# Patient Record
Sex: Female | Born: 1995 | Race: Black or African American | Hispanic: No | Marital: Single | Smoking: Current some day smoker
Health system: Southern US, Community
[De-identification: ages and names within clinical notes are randomized; demographics above are authoritative.]

## PROBLEM LIST (undated history)

## (undated) ENCOUNTER — Inpatient Hospital Stay: Payer: Self-pay

## (undated) DIAGNOSIS — A770 Spotted fever due to Rickettsia rickettsii: Secondary | ICD-10-CM

## (undated) DIAGNOSIS — R569 Unspecified convulsions: Secondary | ICD-10-CM

## (undated) DIAGNOSIS — G6281 Critical illness polyneuropathy: Secondary | ICD-10-CM

## (undated) DIAGNOSIS — Z20828 Contact with and (suspected) exposure to other viral communicable diseases: Secondary | ICD-10-CM

## (undated) DIAGNOSIS — G610 Guillain-Barre syndrome: Secondary | ICD-10-CM

## (undated) DIAGNOSIS — R079 Chest pain, unspecified: Secondary | ICD-10-CM

## (undated) HISTORY — DX: Contact with and (suspected) exposure to other viral communicable diseases: Z20.828

## (undated) HISTORY — PX: NO PAST SURGERIES: SHX2092

## (undated) HISTORY — DX: Spotted fever due to Rickettsia rickettsii: A77.0

---
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
## 2017-08-06 ENCOUNTER — Other Ambulatory Visit: Payer: Self-pay | Admitting: Physician Assistant

## 2017-08-06 DIAGNOSIS — Z3482 Encounter for supervision of other normal pregnancy, second trimester: Secondary | ICD-10-CM

## 2017-08-10 ENCOUNTER — Ambulatory Visit: Payer: Self-pay

## 2017-08-11 ENCOUNTER — Ambulatory Visit
Admission: RE | Admit: 2017-08-11 | Discharge: 2017-08-11 | Disposition: A | Discharge status: Home / Self Care | Payer: Self-pay | Source: Ambulatory Visit | Attending: Physician Assistant | Admitting: Physician Assistant

## 2017-08-11 DIAGNOSIS — Z3482 Encounter for supervision of other normal pregnancy, second trimester: Secondary | ICD-10-CM

## 2017-08-11 DIAGNOSIS — Z3689 Encounter for other specified antenatal screening: Secondary | ICD-10-CM | POA: Insufficient documentation

## 2017-09-08 ENCOUNTER — Other Ambulatory Visit: Payer: Self-pay | Admitting: Obstetrics & Gynecology

## 2017-09-08 DIAGNOSIS — Z363 Encounter for antenatal screening for malformations: Secondary | ICD-10-CM

## 2017-09-09 ENCOUNTER — Ambulatory Visit: Payer: Self-pay

## 2017-09-09 ENCOUNTER — Inpatient Hospital Stay: Admission: RE | Admit: 2017-09-09 | Payer: Self-pay | Source: Ambulatory Visit

## 2017-09-16 ENCOUNTER — Encounter
Admission: RE | Admit: 2017-09-16 | Discharge: 2017-09-16 | Disposition: A | Discharge status: Home / Self Care | Payer: Self-pay | Source: Ambulatory Visit | Attending: Anesthesiology | Admitting: Anesthesiology

## 2017-09-16 NOTE — Consult Note (Signed)
[facility] Anesthesia Consultation  Krista Perkins BJY:[id number] DOB: 11/28/1995 DOA: 09/16/2017 PCP[facility]   Requesting physician: Health department Date of consultation: 09/16/17  Reason for consultation: Obesity during pregnancy  CHIEF COMPLAINT:  Obesity during pregnancy  HISTORY OF PRESENT ILLNESS: Krista Perkins  is a 22 y.o. female with a known history of morbid obesity  PAST MEDICAL HISTORY:  Morbid obesity PAST SURGICAL HISTORY:  SOCIAL HISTORY:  Social History   Tobacco Use  . Smoking status: Not on file  Substance Use Topics  . Alcohol use: Not on file    FAMILY HISTORY: No family history on file.  DRUG ALLERGIES: Allergies not on file  REVIEW OF SYSTEMS:   RESPIRATORY: No cough, shortness of breath, wheezing.  CARDIOVASCULAR: No chest pain, orthopnea, edema.  HEMATOLOGY: No anemia, easy bruising or bleeding SKIN: No rash or lesion. NEUROLOGIC: No tingling, numbness, weakness.  PSYCHIATRY: No anxiety or depression.   MEDICATIONS AT HOME:  Prior to Admission medications   Not on File      PHYSICAL EXAMINATION:   VITAL SIGNS: There were no vitals taken for this visit.  GENERAL:  22 y.o.-year-old patient no acute distress.  HEENT: Head atraumatic, normocephalic. Oropharynx and nasopharynx clear. MP IV, TM distance >3 cm, normal mouth opening.    IMPRESSION AND PLAN:   Krista Perkins  is a 22 y.o. female presenting with obesity during pregnancy. BMI is currently 45 at [redacted] weeks gestation.   We discussed analgesic options during labor including epidural analgesia. Discussed that in obesity there can be increased difficulty with epidural placement or even failure of successful epidural. We also discussed that even after successful epidural placement there is increased risk of catheter migration out of the epidural space that would require catheter replacement. Discussed increased risk of difficult  intubation during pregnancy should an emergency cesarean delivery be required.   We discussed that the pt should transfer of OB care to a facility with a higher maternal level of care designation due to her obesity and airway exam today at 18 weeks. Pt stated that she understood.

## 2017-11-29 ENCOUNTER — Emergency Department
Admission: EM | Admit: 2017-11-29 | Discharge: 2017-11-30 | Disposition: A | Discharge status: Home / Self Care | Payer: Medicaid Other | Attending: Emergency Medicine | Admitting: Emergency Medicine

## 2017-11-29 ENCOUNTER — Emergency Department: Payer: Medicaid Other

## 2017-11-29 DIAGNOSIS — O26891 Other specified pregnancy related conditions, first trimester: Secondary | ICD-10-CM | POA: Diagnosis present

## 2017-11-29 DIAGNOSIS — Z87891 Personal history of nicotine dependence: Secondary | ICD-10-CM | POA: Insufficient documentation

## 2017-11-29 DIAGNOSIS — Z3A08 8 weeks gestation of pregnancy: Secondary | ICD-10-CM | POA: Diagnosis not present

## 2017-11-29 DIAGNOSIS — R079 Chest pain, unspecified: Secondary | ICD-10-CM

## 2017-11-29 DIAGNOSIS — R0789 Other chest pain: Secondary | ICD-10-CM

## 2017-11-29 DIAGNOSIS — Z3490 Encounter for supervision of normal pregnancy, unspecified, unspecified trimester: Secondary | ICD-10-CM

## 2017-11-29 LAB — CBC
HEMATOCRIT: 32.3 % — AB (ref 35.0–47.0)
HEMOGLOBIN: 11 g/dL — AB (ref 12.0–16.0)
MCH: 30.6 pg (ref 26.0–34.0)
MCHC: 34.1 g/dL (ref 32.0–36.0)
MCV: 89.6 fL (ref 80.0–100.0)
Platelets: 234 10*3/uL (ref 150–440)
RBC: 3.61 MIL/uL — ABNORMAL LOW (ref 3.80–5.20)
RDW: 14 % (ref 11.5–14.5)
WBC: 13.9 10*3/uL — ABNORMAL HIGH (ref 3.6–11.0)

## 2017-11-29 LAB — BASIC METABOLIC PANEL
ANION GAP: 8 (ref 5–15)
BUN: 5 mg/dL — ABNORMAL LOW (ref 6–20)
CHLORIDE: 105 mmol/L (ref 101–111)
CO2: 22 mmol/L (ref 22–32)
Calcium: 8.8 mg/dL — ABNORMAL LOW (ref 8.9–10.3)
Creatinine, Ser: 0.49 mg/dL (ref 0.44–1.00)
GFR calc Af Amer: >60 mL/min (ref >60)
GFR calc non Af Amer: >60 mL/min (ref >60)
GLUCOSE: 91 mg/dL (ref 65–99)
POTASSIUM: 3.3 mmol/L — AB (ref 3.5–5.1)
Sodium: 135 mmol/L (ref 135–145)

## 2017-11-29 LAB — POCT PREGNANCY, URINE: Preg Test, Ur: POSITIVE — AB

## 2017-11-29 LAB — TROPONIN I: Troponin I: <0.03 ng/mL (ref <0.03)

## 2017-11-29 NOTE — ED Triage Notes (Signed)
Patient c/o medial chest pain, nausea, and SOB X 2 week.

## 2017-11-29 NOTE — ED Notes (Signed)
POC Urine Preg POSITIVE

## 2017-11-29 NOTE — ED Provider Notes (Signed)
[facility] Emergency Department Provider Note   ____________________________________________   First MD Initiated Contact with Patient 11/29/17 2349     (approximate)  I have reviewed the triage vital signs and the nursing notes.   HISTORY  Chief Complaint Chest Pain    HPI Krista Perkins is a 22 y.o. female G2, P1 approximately 8 months pregnant who presents to the ED from home with a chief complaint of chest pain, nausea and shortness of breath for the past 2 weeks.  Patient reports intermittent symptoms, usually worse at night and when laying flat.  Presents tonight because she is tired of hurting, not because things are worse.  Denies recent fever, chills, cough, congestion, abdominal pain, vaginal bleeding, vomiting or dysuria.  Denies recent travel or trauma.  [facility].   Past medical history None  There are no active problems to display for this patient.   History reviewed. No pertinent surgical history.  Prior to Admission medications   Not on File    Allergies Patient has no known allergies.  No family history on file.  Social History Social History   Tobacco Use  . Smoking status: Former Games developermoker  . Smokeless tobacco: Never Used  Substance Use Topics  . Alcohol use: Not Currently  . Drug use: Never    Review of Systems  Constitutional: No fever/chills Eyes: No visual changes. ENT: No sore throat. Cardiovascular: Positive for chest pain. Respiratory: Positive for shortness of breath. Gastrointestinal: No abdominal pain.  No nausea, no vomiting.  No diarrhea.  No constipation. Genitourinary: Negative for dysuria. Musculoskeletal: Negative for back pain. Skin: Negative for rash. Neurological: Negative for headaches, focal weakness or numbness.   ____________________________________________   PHYSICAL EXAM:  VITAL SIGNS: ED Triage Vitals  Enc Vitals Group     BP  11/29/17 2207 123/70     Pulse Rate 11/29/17 2207 (!) 102     Resp 11/29/17 2207 18     Temp 11/29/17 2207 98.8 F (37.1 C)     Temp Source 11/29/17 2207 Oral     SpO2 11/29/17 2207 99 %     Weight 11/29/17 2207 245 lb (111.1 kg)     Height 11/29/17 2207 5\' 1"  (1.549 m)     Head Circumference --      Peak Flow --      Pain Score 11/29/17 2212 7     Pain Loc --      Pain Edu? --      Excl. in GC? --     Constitutional: Alert and oriented. Well appearing and in no acute distress. Eyes: Conjunctivae are normal. PERRL. EOMI. Head: Atraumatic. Nose: No congestion/rhinnorhea. Mouth/Throat: Mucous membranes are moist.  Oropharynx non-erythematous. Neck: No stridor.   Cardiovascular: Normal rate, regular rhythm. Grossly normal heart sounds.  Good peripheral circulation. Respiratory: Normal respiratory effort.  No retractions. Lungs CTAB.  Anterior chest wall tender to palpation. Gastrointestinal: Obese.  Soft and nontender to light or deep palpation. No distention. No abdominal bruits. No CVA tenderness. Musculoskeletal: No lower extremity tenderness nor edema.  No joint effusions. Neurologic:  Normal speech and language. No gross focal neurologic deficits are appreciated. No gait instability. Skin:  Skin is warm, dry and intact. No rash noted. Psychiatric: Mood and affect are normal. Speech and behavior are normal.  ____________________________________________   LABS (all labs ordered are listed, but only abnormal results are displayed)  Labs Reviewed  BASIC METABOLIC PANEL -  Abnormal; Notable for the following components:      Result Value   Potassium 3.3 (*)    BUN 5 (*)    Calcium 8.8 (*)    All other components within normal limits  CBC - Abnormal; Notable for the following components:   WBC 13.9 (*)    RBC 3.61 (*)    Hemoglobin 11.0 (*)    HCT 32.3 (*)    All other components within normal limits  HCG, QUANTITATIVE, PREGNANCY - Abnormal; Notable for the following  components:   hCG, Beta Chain, Quant, S 6,497 (*)    All other components within normal limits  HEPATIC FUNCTION PANEL - Abnormal; Notable for the following components:   Albumin 3.0 (*)    Bilirubin, Direct <0.1 (*)    All other components within normal limits  POCT PREGNANCY, URINE - Abnormal; Notable for the following components:   Preg Test, Ur POSITIVE (*)    All other components within normal limits  TROPONIN I  LIPASE, BLOOD  POC URINE PREG, ED   ____________________________________________  EKG  ED ECG REPORT I, Teralyn Mullins J, the attending physician, personally viewed and interpreted this ECG.   Date: 11/30/2017  EKG Time: 2209  Rate: 96  Rhythm: normal EKG, normal sinus rhythm  Axis: Normal  Intervals:none  ST&T Change: Nonspecific  ____________________________________________  RADIOLOGY  ED MD interpretation: No acute cardiopulmonary process; nondiagnostic for PE  Official radiology report(s): Dg Chest 2 View  Result Date: 11/29/2017 CLINICAL DATA:  Medial chest pain, nausea, and shortness of breath for 2 weeks. Patient is [redacted] weeks pregnant and was shielded. EXAM: CHEST - 2 VIEW COMPARISON:  12/16/2011 FINDINGS: The heart size and mediastinal contours are within normal limits. Both lungs are clear. The visualized skeletal structures are unremarkable. IMPRESSION: No active cardiopulmonary disease. Electronically Signed   By: Burman Nieves M.D.   On: 11/29/2017 23:03   Ct Angio Chest Pe W/cm &/or Wo Cm  Result Date: 11/30/2017 CLINICAL DATA:  Medial chest pain, nausea, and shortness of breath for 2 weeks. Eight months pregnant and shielded. The scanner paused during the initial injection, resulting in a suboptimal contrast bolus. The patient was reinjected and re-scanned. EXAM: CT ANGIOGRAPHY CHEST WITH CONTRAST TECHNIQUE: Multidetector CT imaging of the chest was performed using the standard protocol during bolus administration of intravenous contrast. Multiplanar  CT image reconstructions and MIPs were obtained to evaluate the vascular anatomy. CONTRAST:  75mL ISOVUE-370 IOPAMIDOL (ISOVUE-370) INJECTION 76% COMPARISON:  12/16/2011 FINDINGS: Cardiovascular: In spite of reinjection and rescanning of the patient, there is poor bolus timing resulting in a poor contrast bolus. Images are nondiagnostic for evaluation of the pulmonary arteries for pulmonary embolus. Normal caliber thoracic aorta without evidence of dissection. Great vessel origins are patent. Heart size is normal. No pericardial effusion. Mediastinum/Nodes: Small esophageal hiatal hernia. Esophagus is otherwise decompressed. No significant lymphadenopathy in the chest. Axillary lymph nodes are not pathologically enlarged. Lungs/Pleura: Motion artifact limits evaluation. No consolidation or edema is suggested. No pleural effusions. No pneumothorax. Airways appear patent. Upper Abdomen: No focal acute abnormality is demonstrated. Musculoskeletal: No chest wall abnormality. No acute or significant osseous findings. Review of the MIP images confirms the above findings. IMPRESSION: 1. Due to poor contrast bolus timing, the examination is nondiagnostic for evaluation of the pulmonary arteries. 2. No evidence of active pulmonary disease. Electronically Signed   By: Burman Nieves M.D.   On: 11/30/2017 02:19    ____________________________________________   PROCEDURES  Procedure(s) performed: None  Procedures  Critical Care performed: No  ____________________________________________   INITIAL IMPRESSION / ASSESSMENT AND PLAN / ED COURSE  As part of my medical decision making, I reviewed the following data within the electronic MEDICAL RECORD NUMBER Nursing notes reviewed and incorporated, Labs reviewed, EKG interpreted, Old chart reviewed, Radiograph reviewed and Notes from prior ED visits   22 year old female G2P1 approximately 8 months pregnant who presents with a 2-week history of central chest pain  and shortness of breath.  Differential diagnosis includes but is not limited to CAD, PE, aortic dissection, musculoskeletal, etc.  Will add LFTs and lipase.  Chest x-ray unremarkable.  Will obtain CT chest to evaluate for PE.  Will try 20 mg IV Pepcid and 4 mg IV Zofran for symptomatic relief of patient's symptoms.   Clinical Course as of Dec 01 322  Mon Nov 30, 2017  0221 Patient resting in no acute distress.  Awaiting results of CT scan.  Fetal heart rate noted.   [JS]  0225 Updated patient on all test results.  I have very low suspicion for pulmonary embolus in this patient.  She is not hypoxic, has normal troponin in the setting of a 2-week history of chest pain which is reproducible on exam.  No pain after Pepcid administration.  There is no calf swelling or tenderness.  No evidence of pregnancy-induced hypertension.  Will discharge home with prescription for Zofran and patient will follow-up with her OB/GYN.  Strict return precautions given.  Patient verbalizes understanding and agrees with plan of care.   [JS]    Clinical Course User Index [JS] Irean Hong, MD     ____________________________________________   FINAL CLINICAL IMPRESSION(S) / ED DIAGNOSES  Final diagnoses:  Chest wall pain  Nonspecific chest pain  Pregnancy, unspecified gestational age     ED Discharge Orders    None       Note:  This document was prepared using Dragon voice recognition software and may include unintentional dictation errors.    Irean Hong, MD 11/30/17 [phone]

## 2017-11-30 ENCOUNTER — Emergency Department: Payer: Medicaid Other

## 2017-11-30 LAB — HEPATIC FUNCTION PANEL
ALBUMIN: 3 g/dL — AB (ref 3.5–5.0)
ALT: 19 U/L (ref 14–54)
AST: 22 U/L (ref 15–41)
Alkaline Phosphatase: 112 U/L (ref 38–126)
Bilirubin, Direct: <0.1 mg/dL — ABNORMAL LOW (ref 0.1–0.5)
TOTAL PROTEIN: 7 g/dL (ref 6.5–8.1)
Total Bilirubin: 0.3 mg/dL (ref 0.3–1.2)

## 2017-11-30 LAB — LIPASE, BLOOD: LIPASE: 20 U/L (ref 11–51)

## 2017-11-30 LAB — HCG, QUANTITATIVE, PREGNANCY: hCG, Beta Chain, Quant, S: 6497 m[IU]/mL — ABNORMAL HIGH (ref <5)

## 2017-11-30 MED ORDER — IOPAMIDOL (ISOVUE-370) INJECTION 76%
75.0000 mL | Freq: Once | INTRAVENOUS | Status: AC | PRN
  Administered 2017-11-30: 75 mL via INTRAVENOUS

## 2017-11-30 MED ORDER — FAMOTIDINE IN NACL 20-0.9 MG/50ML-% IV SOLN
20.0000 mg | Freq: Once | INTRAVENOUS | Status: AC
  Administered 2017-11-30: 20 mg via INTRAVENOUS
  Filled 2017-11-30: qty 50

## 2017-11-30 MED ORDER — ONDANSETRON HCL 4 MG/2ML IJ SOLN
4.0000 mg | Freq: Once | INTRAMUSCULAR | Status: AC
  Administered 2017-11-30: 4 mg via INTRAVENOUS
  Filled 2017-11-30: qty 2

## 2017-11-30 MED ORDER — ONDANSETRON 4 MG PO TBDP: 4.0000 mg | ORAL_TABLET | Freq: Three times a day (TID) | ORAL | 0 refills | Status: DC | PRN

## 2017-11-30 NOTE — Discharge Instructions (Addendum)
1.  You may take Zofran as needed for nausea. 2.  Return to the ER for worsening symptoms, persistent vomiting, difficulty breathing or other concerns. 

## 2017-12-08 ENCOUNTER — Telehealth: Payer: Self-pay | Admitting: Obstetrics & Gynecology

## 2017-12-08 NOTE — Telephone Encounter (Signed)
Called and left vociemail for patient to call back to be schedule for NOB transfer from [facility]

## 2017-12-08 NOTE — Telephone Encounter (Signed)
[facility]

## 2017-12-12 ENCOUNTER — Observation Stay
Admission: EM | Admit: 2017-12-12 | Discharge: 2017-12-12 | Disposition: A | Discharge status: Home / Self Care | Payer: Medicaid Other | Attending: Obstetrics and Gynecology | Admitting: Obstetrics and Gynecology

## 2017-12-12 DIAGNOSIS — Z87891 Personal history of nicotine dependence: Secondary | ICD-10-CM | POA: Diagnosis not present

## 2017-12-12 DIAGNOSIS — Z3A00 Weeks of gestation of pregnancy not specified: Secondary | ICD-10-CM | POA: Diagnosis not present

## 2017-12-12 DIAGNOSIS — Z833 Family history of diabetes mellitus: Secondary | ICD-10-CM | POA: Diagnosis not present

## 2017-12-12 DIAGNOSIS — O368130 Decreased fetal movements, third trimester, not applicable or unspecified: Secondary | ICD-10-CM

## 2017-12-12 DIAGNOSIS — O368190 Decreased fetal movements, unspecified trimester, not applicable or unspecified: Secondary | ICD-10-CM | POA: Diagnosis present

## 2017-12-12 DIAGNOSIS — O0993 Supervision of high risk pregnancy, unspecified, third trimester: Secondary | ICD-10-CM

## 2017-12-12 DIAGNOSIS — Z8249 Family history of ischemic heart disease and other diseases of the circulatory system: Secondary | ICD-10-CM | POA: Insufficient documentation

## 2017-12-12 DIAGNOSIS — Z3A35 35 weeks gestation of pregnancy: Secondary | ICD-10-CM

## 2017-12-12 LAB — URINALYSIS, ROUTINE W REFLEX MICROSCOPIC
Bilirubin Urine: NEGATIVE
GLUCOSE, UA: NEGATIVE mg/dL
Hgb urine dipstick: NEGATIVE
Ketones, ur: NEGATIVE mg/dL
Nitrite: NEGATIVE
PH: 5 (ref 5.0–8.0)
PROTEIN: 100 mg/dL — AB
Specific Gravity, Urine: 1.032 — ABNORMAL HIGH (ref 1.005–1.030)
Squamous Epithelial / LPF: >50 — ABNORMAL HIGH (ref 0–5)

## 2017-12-12 MED ORDER — NITROFURANTOIN MONOHYD MACRO 100 MG PO CAPS: 100.0000 mg | ORAL_CAPSULE | Freq: Two times a day (BID) | ORAL | 0 refills | Status: AC

## 2017-12-12 NOTE — OB Triage Note (Signed)
Patient came around 2100 from home c/o lower abdominal pressure and decrease fetal movement. Patient reports abdominal pressure for past few days but today the pain worsen with increase activity. Patient reports no fetal movement at all today. EFM placed. Fetal movement noticed. Category I strip. Maternal vital signs WNL. PAaient denies LOF. Denies bleeding. No risk factors this pregnancy per patient.

## 2017-12-12 NOTE — Final Progress Note (Signed)
Triage Note  Krista Perkins is an 22 y.o. female.  HPI: She presented to triage complaining of lower pelvic pressure and decreased fetal movement. She said that she had not felt the baby move all day. She also mentioned that [facility] she was told that she may have a urinary tract infection, but was not given a prescription for any antibiotics. Denies contractions. Denies leakage of fluid. Denies vaginal bleeding.   Past Medical History:  Diagnosis Date  . Medical history non-contributory     History reviewed. No pertinent surgical history.  Family History  Problem Relation Age of Onset  . Diabetes Mother   . Heart disease Mother     Social History:  reports that she has quit smoking. She has never used smokeless tobacco. She reports that she drank alcohol. She reports that she does not use drugs.  Allergies: No Known Allergies  Medications: I have reviewed the patient's current medications.  Results for orders placed or performed during the hospital encounter of 12/12/17 (from the past 48 hour(s))  Urinalysis, Routine w reflex microscopic     Status: Abnormal   Collection Time: 12/12/17  9:41 PM  Result Value Ref Range   Color, Urine AMBER (A) YELLOW    Comment: BIOCHEMICALS MAY BE AFFECTED BY COLOR   APPearance CLOUDY (A) CLEAR   Specific Gravity, Urine 1.032 (H) 1.005 - 1.030   pH 5.0 5.0 - 8.0   Glucose, UA NEGATIVE NEGATIVE mg/dL   Hgb urine dipstick NEGATIVE NEGATIVE   Bilirubin Urine NEGATIVE NEGATIVE   Ketones, ur NEGATIVE NEGATIVE mg/dL   Protein, ur 960100 (A) NEGATIVE mg/dL   Nitrite NEGATIVE NEGATIVE   Leukocytes, UA MODERATE (A) NEGATIVE   RBC / HPF 11-20 0 - 5 RBC/hpf   WBC, UA >50 (H) 0 - 5 WBC/hpf   Bacteria, UA MANY (A) NONE SEEN   Squamous Epithelial / LPF >50 (H) 0 - 5   Mucus PRESENT     Comment: Performed at [facility], [address] [place], [place] [id number]    No results found.  Review of Systems   Constitutional: Negative for chills, fever, malaise/fatigue and weight loss.  HENT: Negative for congestion, hearing loss and sinus pain.   Eyes: Negative for blurred vision and double vision.  Respiratory: Negative for cough, sputum production, shortness of breath and wheezing.   Cardiovascular: Negative for chest pain, palpitations, orthopnea and leg swelling.  Gastrointestinal: Negative for abdominal pain, constipation, diarrhea, nausea and vomiting.  Genitourinary: Negative for dysuria, flank pain, frequency, hematuria and urgency.  Musculoskeletal: Negative for back pain, falls and joint pain.  Skin: Negative for itching and rash.  Neurological: Negative for dizziness and headaches.  Psychiatric/Behavioral: Negative for depression, substance abuse and suicidal ideas. The patient is not nervous/anxious.    Blood pressure 133/74, pulse 98, temperature 98.7 F (37.1 C), temperature source Oral, resp. rate 18.  NST: 140 bpm baseline, moderate variability, 15x15 accelerations, none decelerations. Reactive, category I tracing   Assessment/Plan: 22 yo with decreased fetal movement and a UTI. Fetal status reassuring, follow up closely planned for July 3rd.  Prescription sent for her urinary tract infection, urine culture pending.   Discharged home.  Christanna R Schuman 12/12/2017, 10:33 PM

## 2017-12-12 NOTE — ED Notes (Signed)
Pt called for triage; no answer; told pt is in the BR

## 2017-12-12 NOTE — Discharge Summary (Signed)
  Please see final progress note. 

## 2017-12-14 LAB — URINE CULTURE

## 2017-12-15 NOTE — Progress Notes (Signed)
Sent with a prescription for a UTI

## 2017-12-16 ENCOUNTER — Encounter: Payer: Self-pay | Admitting: Obstetrics & Gynecology

## 2017-12-16 ENCOUNTER — Encounter: Payer: Self-pay | Admitting: Certified Nurse Midwife

## 2017-12-20 ENCOUNTER — Other Ambulatory Visit: Payer: Self-pay

## 2017-12-20 ENCOUNTER — Encounter: Payer: Self-pay | Admitting: Obstetrics and Gynecology

## 2017-12-20 ENCOUNTER — Inpatient Hospital Stay
Admission: EM | Admit: 2017-12-20 | Discharge: 2017-12-22 | DRG: 832 | Disposition: A | Discharge status: Home / Self Care | Payer: Medicaid Other | Source: Ambulatory Visit | Attending: Obstetrics and Gynecology | Admitting: Obstetrics and Gynecology

## 2017-12-20 DIAGNOSIS — O99613 Diseases of the digestive system complicating pregnancy, third trimester: Principal | ICD-10-CM | POA: Diagnosis present

## 2017-12-20 DIAGNOSIS — N12 Tubulo-interstitial nephritis, not specified as acute or chronic: Secondary | ICD-10-CM | POA: Diagnosis present

## 2017-12-20 DIAGNOSIS — O99283 Endocrine, nutritional and metabolic diseases complicating pregnancy, third trimester: Secondary | ICD-10-CM | POA: Diagnosis present

## 2017-12-20 DIAGNOSIS — Z8249 Family history of ischemic heart disease and other diseases of the circulatory system: Secondary | ICD-10-CM

## 2017-12-20 DIAGNOSIS — Z3A36 36 weeks gestation of pregnancy: Secondary | ICD-10-CM

## 2017-12-20 DIAGNOSIS — E876 Hypokalemia: Secondary | ICD-10-CM | POA: Diagnosis present

## 2017-12-20 DIAGNOSIS — Z87891 Personal history of nicotine dependence: Secondary | ICD-10-CM

## 2017-12-20 DIAGNOSIS — R109 Unspecified abdominal pain: Secondary | ICD-10-CM

## 2017-12-20 DIAGNOSIS — Z833 Family history of diabetes mellitus: Secondary | ICD-10-CM

## 2017-12-20 DIAGNOSIS — K2970 Gastritis, unspecified, without bleeding: Secondary | ICD-10-CM | POA: Diagnosis present

## 2017-12-20 DIAGNOSIS — O99013 Anemia complicating pregnancy, third trimester: Secondary | ICD-10-CM | POA: Diagnosis present

## 2017-12-20 DIAGNOSIS — Z6841 Body Mass Index (BMI) 40.0 and over, adult: Secondary | ICD-10-CM

## 2017-12-20 HISTORY — DX: Morbid (severe) obesity due to excess calories: E66.01

## 2017-12-20 HISTORY — DX: Chest pain, unspecified: R07.9

## 2017-12-20 NOTE — OB Triage Note (Signed)
Pt presents to L&D with c/o R sided back/flank pain, N/V for >1week. Pt reports she was recently diagnosed with a UTI but was unable to take antibiotics d/t inability to take pills. Pt denies contractions, vaginal bleeding, decreased fetal movement, or leaking of fluid. Toco and EFM applied and explained, all questions answered. Will monitor closely.

## 2017-12-21 ENCOUNTER — Observation Stay: Payer: Medicaid Other

## 2017-12-21 DIAGNOSIS — O99613 Diseases of the digestive system complicating pregnancy, third trimester: Secondary | ICD-10-CM | POA: Diagnosis present

## 2017-12-21 DIAGNOSIS — Z87891 Personal history of nicotine dependence: Secondary | ICD-10-CM | POA: Diagnosis not present

## 2017-12-21 DIAGNOSIS — O99013 Anemia complicating pregnancy, third trimester: Secondary | ICD-10-CM | POA: Diagnosis present

## 2017-12-21 DIAGNOSIS — O99283 Endocrine, nutritional and metabolic diseases complicating pregnancy, third trimester: Secondary | ICD-10-CM | POA: Diagnosis present

## 2017-12-21 DIAGNOSIS — R112 Nausea with vomiting, unspecified: Secondary | ICD-10-CM | POA: Diagnosis present

## 2017-12-21 DIAGNOSIS — K2970 Gastritis, unspecified, without bleeding: Secondary | ICD-10-CM | POA: Diagnosis present

## 2017-12-21 DIAGNOSIS — R109 Unspecified abdominal pain: Secondary | ICD-10-CM | POA: Diagnosis not present

## 2017-12-21 DIAGNOSIS — O212 Late vomiting of pregnancy: Secondary | ICD-10-CM

## 2017-12-21 DIAGNOSIS — Z3A36 36 weeks gestation of pregnancy: Secondary | ICD-10-CM | POA: Diagnosis not present

## 2017-12-21 DIAGNOSIS — Z8249 Family history of ischemic heart disease and other diseases of the circulatory system: Secondary | ICD-10-CM | POA: Diagnosis not present

## 2017-12-21 DIAGNOSIS — O9989 Other specified diseases and conditions complicating pregnancy, childbirth and the puerperium: Secondary | ICD-10-CM | POA: Diagnosis not present

## 2017-12-21 DIAGNOSIS — Z6841 Body Mass Index (BMI) 40.0 and over, adult: Secondary | ICD-10-CM | POA: Diagnosis not present

## 2017-12-21 DIAGNOSIS — R079 Chest pain, unspecified: Secondary | ICD-10-CM | POA: Diagnosis not present

## 2017-12-21 DIAGNOSIS — N12 Tubulo-interstitial nephritis, not specified as acute or chronic: Secondary | ICD-10-CM | POA: Diagnosis present

## 2017-12-21 DIAGNOSIS — R000 Tachycardia, unspecified: Secondary | ICD-10-CM | POA: Diagnosis not present

## 2017-12-21 DIAGNOSIS — E876 Hypokalemia: Secondary | ICD-10-CM | POA: Diagnosis present

## 2017-12-21 DIAGNOSIS — Z833 Family history of diabetes mellitus: Secondary | ICD-10-CM | POA: Diagnosis not present

## 2017-12-21 DIAGNOSIS — R0789 Other chest pain: Secondary | ICD-10-CM | POA: Diagnosis not present

## 2017-12-21 LAB — URINALYSIS, COMPLETE (UACMP) WITH MICROSCOPIC
Bilirubin Urine: NEGATIVE
Glucose, UA: NEGATIVE mg/dL
HGB URINE DIPSTICK: NEGATIVE
Ketones, ur: 20 mg/dL — AB
NITRITE: NEGATIVE
Protein, ur: NEGATIVE mg/dL
SPECIFIC GRAVITY, URINE: 1.009 (ref 1.005–1.030)
pH: 7 (ref 5.0–8.0)

## 2017-12-21 LAB — COMPREHENSIVE METABOLIC PANEL
ALK PHOS: 112 U/L (ref 38–126)
ALT: 14 U/L (ref 0–44)
ANION GAP: 5 (ref 5–15)
AST: 26 U/L (ref 15–41)
Albumin: 2.4 g/dL — ABNORMAL LOW (ref 3.5–5.0)
BILIRUBIN TOTAL: 0.6 mg/dL (ref 0.3–1.2)
CO2: 22 mmol/L (ref 22–32)
Calcium: 8.3 mg/dL — ABNORMAL LOW (ref 8.9–10.3)
Chloride: 109 mmol/L (ref 98–111)
Creatinine, Ser: 0.43 mg/dL — ABNORMAL LOW (ref 0.44–1.00)
GFR calc Af Amer: >60 mL/min (ref >60)
GLUCOSE: 92 mg/dL (ref 70–99)
POTASSIUM: 3.2 mmol/L — AB (ref 3.5–5.1)
Sodium: 136 mmol/L (ref 135–145)
TOTAL PROTEIN: 6.3 g/dL — AB (ref 6.5–8.1)

## 2017-12-21 LAB — CBC
HEMATOCRIT: 29.3 % — AB (ref 35.0–47.0)
HEMOGLOBIN: 9.9 g/dL — AB (ref 12.0–16.0)
MCH: 29.5 pg (ref 26.0–34.0)
MCHC: 34 g/dL (ref 32.0–36.0)
MCV: 86.7 fL (ref 80.0–100.0)
Platelets: 189 10*3/uL (ref 150–440)
RBC: 3.38 MIL/uL — ABNORMAL LOW (ref 3.80–5.20)
RDW: 14.3 % (ref 11.5–14.5)
WBC: 9.8 10*3/uL (ref 3.6–11.0)

## 2017-12-21 LAB — LIPASE, BLOOD: Lipase: 19 U/L (ref 11–51)

## 2017-12-21 MED ORDER — DIPHENHYDRAMINE HCL 50 MG/ML IJ SOLN
25.0000 mg | Freq: Three times a day (TID) | INTRAMUSCULAR | Status: DC | PRN
  Administered 2017-12-21: 25 mg via INTRAVENOUS
  Filled 2017-12-21: qty 1

## 2017-12-21 MED ORDER — HYDROXYZINE HCL 25 MG PO TABS
25.0000 mg | ORAL_TABLET | Freq: Four times a day (QID) | ORAL | Status: DC | PRN
  Administered 2017-12-21: 25 mg via ORAL
  Filled 2017-12-21 (×3): qty 1

## 2017-12-21 MED ORDER — ONDANSETRON HCL 4 MG/2ML IJ SOLN
4.0000 mg | Freq: Four times a day (QID) | INTRAMUSCULAR | Status: DC | PRN
  Administered 2017-12-21 – 2017-12-22 (×2): 4 mg via INTRAVENOUS
  Filled 2017-12-21: qty 2

## 2017-12-21 MED ORDER — DIPHENHYDRAMINE HCL 50 MG/ML IJ SOLN
25.0000 mg | Freq: Once | INTRAMUSCULAR | Status: AC
  Administered 2017-12-21: 25 mg via INTRAVENOUS

## 2017-12-21 MED ORDER — MORPHINE SULFATE (PF) 2 MG/ML IV SOLN
INTRAVENOUS | Status: AC
  Filled 2017-12-21: qty 1

## 2017-12-21 MED ORDER — LACTATED RINGERS IV BOLUS
1000.0000 mL | Freq: Once | INTRAVENOUS | Status: AC
  Administered 2017-12-21: 1000 mL via INTRAVENOUS

## 2017-12-21 MED ORDER — MORPHINE SULFATE (PF) 2 MG/ML IV SOLN
1.0000 mg | INTRAVENOUS | Status: DC | PRN
  Administered 2017-12-21 (×4): 1 mg via INTRAVENOUS
  Filled 2017-12-21 (×5): qty 1

## 2017-12-21 MED ORDER — POTASSIUM CHLORIDE 2 MEQ/ML IV SOLN
INTRAVENOUS | Status: DC
  Filled 2017-12-21: qty 1000

## 2017-12-21 MED ORDER — MORPHINE SULFATE (PF) 2 MG/ML IV SOLN
1.0000 mg | Freq: Once | INTRAVENOUS | Status: AC
  Administered 2017-12-21: 1 mg via INTRAVENOUS

## 2017-12-21 MED ORDER — DIPHENHYDRAMINE HCL 50 MG/ML IJ SOLN
INTRAMUSCULAR | Status: AC
  Filled 2017-12-21: qty 1

## 2017-12-21 MED ORDER — ONDANSETRON HCL 4 MG/2ML IJ SOLN
INTRAMUSCULAR | Status: AC
  Filled 2017-12-21: qty 2

## 2017-12-21 MED ORDER — SODIUM CHLORIDE 0.9 % IV SOLN
1.0000 g | INTRAVENOUS | Status: DC
  Administered 2017-12-21: 1 g via INTRAVENOUS
  Filled 2017-12-21: qty 10
  Filled 2017-12-21: qty 1

## 2017-12-21 MED ORDER — SODIUM CHLORIDE FLUSH 0.9 % IV SOLN
INTRAVENOUS | Status: AC
  Filled 2017-12-21: qty 10

## 2017-12-21 MED ORDER — POTASSIUM CHLORIDE IN NACL 20-0.9 MEQ/L-% IV SOLN
INTRAVENOUS | Status: DC
  Administered 2017-12-21: 1000 mL via INTRAVENOUS
  Administered 2017-12-21 – 2017-12-22 (×3): via INTRAVENOUS
  Filled 2017-12-21 (×7): qty 1000

## 2017-12-21 NOTE — H&P (Addendum)
Obstetric H&P   Chief Complaint: Flank pain, nausea and vomiting  Prenatal Care Provider: [facility]  History of Present Illness: 22 y.o. G3P1 [id number] by 01/18/2018, Date entered prior to episode creation presenting for right flank pain that has been present for three weeks. She endorses 9/10 pain in her right lower quadrant and right flank. Nothing makes it better or worse. She was seen at [facility] and here in triage for UTI symptoms on 6/29, and was prescribed antibiotics but did not take them because she cannot swallow pills. She has also been nauseated and has vomited for the past week. She says she has been unable to keep anything down except water. The last time she vomited was around 2000 and it was clear fluid emesis. She endorses good fetal movement. She also has a rash on her arms, right hand, upper thighs, and lower abdomen. It does not itch. She says that she has not been exposed to new food, clothing, or detergents, or anything outside such as plants or insects. She denies fever, vaginal bleeding, and loss of fluid.  Pregravid weight Pregravid weight not on file Total Weight Gain Not found.    Review of Systems: Review of systems negative unless otherwise noted in HPI.  Past Medical History: Past Medical History:  Diagnosis Date  . Medical history non-contributory     Past Surgical History: Past Surgical History:  Procedure Laterality Date  . NO PAST SURGERIES      Past Obstetric History: G2P1001  Past Gynecologic History: None  Family History: Family History  Problem Relation Age of Onset  . Diabetes Mother   . Heart disease Mother     Social History: Social History   Socioeconomic History  . Marital status: Single    Spouse name: Not on file  . Number of children: Not on file  . Years of education: Not on file  . Highest education level: Not on file  Occupational History  . Not on file  Social Needs  . Financial resource strain: Not on file  . Food insecurity:     Worry: Not on file    Inability: Not on file  . Transportation needs:    Medical: Not on file    Non-medical: Not on file  Tobacco Use  . Smoking status: Former Games developermoker  . Smokeless tobacco: Never Used  Substance and Sexual Activity  . Alcohol use: Not Currently  . Drug use: Never  . Sexual activity: Not Currently  Lifestyle  . Physical activity:    Days per week: Not on file    Minutes per session: Not on file  . Stress: Not on file  Relationships  . Social connections:    Talks on phone: Not on file    Gets together: Not on file    Attends religious service: Not on file    Active member of club or organization: Not on file    Attends meetings of clubs or organizations: Not on file    Relationship status: Not on file  . Intimate partner violence:    Fear of current or ex partner: Not on file    Emotionally abused: Not on file    Physically abused: Not on file    Forced sexual activity: Not on file  Other Topics Concern  . Not on file  Social History Narrative  . Not on file    Medications: Prior to Admission medications   Medication Sig Start Date End Date Taking? Authorizing Provider  Prenatal Vit-Fe Fumarate-FA (  PRENATAL MULTIVITAMIN) TABS tablet Take 1 tablet by mouth daily at 12 noon.    [provider]    Allergies: No Known Allergies  Physical Exam: Vitals: Blood pressure 121/63, pulse (!) 103, temperature 98.8 F (37.1 C), temperature source Oral, resp. rate 16, SpO2 100 %.  General: NAD HEENT: normocephalic, anicteric Pulmonary: No increased work of breathing, CTAB Cardiovascular: RRR, no murmurs, rubs, or gallops Abdomen: Gravid, tender in RLQ. No tenderness anywhere else on her abdomen. Back: CVAT present on right side, absent on left. Genitourinary: normal external genitalia, no discharge visible, cervical exam deferred Extremities: no edema, erythema, or tenderness Neurologic: Grossly intact Psychiatric: mood appropriate, affect  full  NST: Baseline: 150 Variability: moderate Accelerations: present Decelerations: absent Tocometry: occasional irritability Reactive, category I tracing.  Labs: Results for orders placed or performed during the hospital encounter of 12/20/17 (from the past 24 hour(s))  Urinalysis, Complete w Microscopic     Status: Abnormal   Collection Time: 12/21/17 12:27 AM  Result Value Ref Range   Color, Urine YELLOW (A) YELLOW   APPearance CLEAR (A) CLEAR   Specific Gravity, Urine 1.009 1.005 - 1.030   pH 7.0 5.0 - 8.0   Glucose, UA NEGATIVE NEGATIVE mg/dL   Hgb urine dipstick NEGATIVE NEGATIVE   Bilirubin Urine NEGATIVE NEGATIVE   Ketones, ur 20 (A) NEGATIVE mg/dL   Protein, ur NEGATIVE NEGATIVE mg/dL   Nitrite NEGATIVE NEGATIVE   Leukocytes, UA TRACE (A) NEGATIVE   RBC / HPF 0-5 0 - 5 RBC/hpf   WBC, UA 6-10 0 - 5 WBC/hpf   Bacteria, UA RARE (A) NONE SEEN   Squamous Epithelial / LPF 0-5 0 - 5   Mucus PRESENT     Assessment: 22 y.o. G3P1 [redacted]w[redacted]d by 01/18/2018, Date entered prior to episode creation with right flank pain and CVAT on the right side, nausea and vomiting for the past week, and a rash.  Plan: 1) Collected urine culture and GBS. CBC and CMP unremarkable except for anemia and mild hypokalemia. Renal ultrasound also unremarkable. Admit to inpatient for antibiotic therapy for pyelonephritis: ceftriaxone 1g q 24h.  2) Fetus - FWB Category I. NST daily and Doppler q shift.  3) Replete potassium with NS with 20 MeQ Potassium.  4) Advance diet as tolerated.  5) Pain medication PRN: morphine 1 mg.  Marcelyn Bruins, CNM 12/21/2017  7:31 AM

## 2017-12-21 NOTE — Progress Notes (Signed)
Pt to US, transported per RN via wheelchair.

## 2017-12-21 NOTE — Progress Notes (Signed)
Patient resting comfortably in bed. She denies pain at this time since she received a dose of morphine. Per her report she noticed the rash on legs, arms, lower abdomen when she arrived at the hospital last night. She admits itching and perceives it as getting worse since she has been here. She admits positive fetal movement and denies contractions, leakage of fluid or vaginal bleeding.   Vital Signs: BP (!) 126/55 (BP Location: Left Arm)   Pulse (!) 115   Temp 98.5 F (36.9 C) (Oral)   Resp 18   SpO2 100%  Constitutional: Well nourished, well developed female in no acute distress.  HEENT: normal Skin: Warm and dry. Welt like rash on upper thighs, upper arms and lower abdomen Cardiovascular: Regular rate and rhythm.   Respiratory: Clear to auscultation bilateral. Normal respiratory effort Abdomen: FHT baseline 135 with moderate variability, +accelerations, -decelerations on NST at 6:45 AM Back: no CVAT at this time Psych: Alert and Oriented x3. No memory deficits. Normal mood and affect.   22 yo G3P1 at 36 weeks 0 days with suspected pyelonephritis and what appears to be allergic reaction  Continue Antepartum care and medications as ordered Benadryl 25 mg IV Q 8 hours PRN for allergic reaction NST Q day Fetal heart tones Q shift Regular diet  Tresea MallJane Mariangela Heldt, CNM

## 2017-12-22 ENCOUNTER — Encounter: Payer: Self-pay | Admitting: Nurse Practitioner

## 2017-12-22 ENCOUNTER — Inpatient Hospital Stay (HOSPITAL_COMMUNITY)
Admit: 2017-12-22 | Discharge: 2017-12-22 | Disposition: A | Discharge status: Home / Self Care | Payer: Medicaid Other | Attending: Nurse Practitioner | Admitting: Nurse Practitioner

## 2017-12-22 DIAGNOSIS — R079 Chest pain, unspecified: Secondary | ICD-10-CM

## 2017-12-22 DIAGNOSIS — R109 Unspecified abdominal pain: Secondary | ICD-10-CM

## 2017-12-22 DIAGNOSIS — R000 Tachycardia, unspecified: Secondary | ICD-10-CM

## 2017-12-22 DIAGNOSIS — Z3A36 36 weeks gestation of pregnancy: Secondary | ICD-10-CM

## 2017-12-22 DIAGNOSIS — R0789 Other chest pain: Secondary | ICD-10-CM

## 2017-12-22 DIAGNOSIS — O9989 Other specified diseases and conditions complicating pregnancy, childbirth and the puerperium: Secondary | ICD-10-CM | POA: Diagnosis not present

## 2017-12-22 LAB — CBC
HCT: 29.6 % — ABNORMAL LOW (ref 35.0–47.0)
Hemoglobin: 10.2 g/dL — ABNORMAL LOW (ref 12.0–16.0)
MCH: 30.2 pg (ref 26.0–34.0)
MCHC: 34.4 g/dL (ref 32.0–36.0)
MCV: 87.7 fL (ref 80.0–100.0)
Platelets: 170 K/uL (ref 150–440)
RBC: 3.38 MIL/uL — ABNORMAL LOW (ref 3.80–5.20)
RDW: 14.3 % (ref 11.5–14.5)
WBC: 10.3 K/uL (ref 3.6–11.0)

## 2017-12-22 LAB — URINE CULTURE

## 2017-12-22 LAB — ECHOCARDIOGRAM COMPLETE
Height: 61 in
Weight: 3920 [oz_av]

## 2017-12-22 LAB — BASIC METABOLIC PANEL WITH GFR
Anion gap: 7 (ref 5–15)
BUN: <5 mg/dL — ABNORMAL LOW (ref 6–20)
CO2: 19 mmol/L — ABNORMAL LOW (ref 22–32)
Calcium: 8.5 mg/dL — ABNORMAL LOW (ref 8.9–10.3)
Chloride: 107 mmol/L (ref 98–111)
Creatinine, Ser: 0.37 mg/dL — ABNORMAL LOW (ref 0.44–1.00)
GFR calc Af Amer: >60 mL/min
GFR calc non Af Amer: >60 mL/min
Glucose, Bld: 107 mg/dL — ABNORMAL HIGH (ref 70–99)
Potassium: 3.5 mmol/L (ref 3.5–5.1)
Sodium: 133 mmol/L — ABNORMAL LOW (ref 135–145)

## 2017-12-22 LAB — URINE DRUG SCREEN, QUALITATIVE (ARMC ONLY)
Amphetamines, Ur Screen: NOT DETECTED
Benzodiazepine, Ur Scrn: NOT DETECTED
Cannabinoid 50 Ng, Ur ~~LOC~~: NOT DETECTED
Cocaine Metabolite,Ur ~~LOC~~: NOT DETECTED
MDMA (Ecstasy)Ur Screen: NOT DETECTED
Methadone Scn, Ur: NOT DETECTED
Opiate, Ur Screen: POSITIVE — AB
Phencyclidine (PCP) Ur S: NOT DETECTED
Tricyclic, Ur Screen: NOT DETECTED

## 2017-12-22 LAB — TROPONIN I
Troponin I: <0.03 ng/mL
Troponin I: <0.03 ng/mL

## 2017-12-22 MED ORDER — OMEPRAZOLE 40 MG PO CPDR: 40.0000 mg | DELAYED_RELEASE_CAPSULE | Freq: Every day | ORAL | 11 refills | Status: DC

## 2017-12-22 MED ORDER — PERFLUTREN LIPID MICROSPHERE
1.0000 mL | INTRAVENOUS | Status: AC | PRN
  Administered 2017-12-22: 2 mL via INTRAVENOUS
  Filled 2017-12-22: qty 10

## 2017-12-22 MED ORDER — GI COCKTAIL ~~LOC~~
30.0000 mL | Freq: Once | ORAL | Status: AC
  Administered 2017-12-22: 30 mL via ORAL
  Filled 2017-12-22: qty 30

## 2017-12-22 MED ORDER — SUCRALFATE 1 GM/10ML PO SUSP: 1.0000 g | Freq: Three times a day (TID) | ORAL | 0 refills | Status: DC

## 2017-12-22 MED ORDER — MORPHINE SULFATE (PF) 2 MG/ML IV SOLN
2.0000 mg | INTRAVENOUS | Status: DC | PRN
  Administered 2017-12-22 (×2): 2 mg via INTRAVENOUS
  Filled 2017-12-22: qty 1

## 2017-12-22 MED ORDER — SUCRALFATE 1 GM/10ML PO SUSP
1.0000 g | Freq: Three times a day (TID) | ORAL | Status: DC
  Administered 2017-12-22: 1 g via ORAL
  Filled 2017-12-22: qty 10

## 2017-12-22 MED ORDER — ONDANSETRON 4 MG PO TBDP: 4.0000 mg | ORAL_TABLET | Freq: Four times a day (QID) | ORAL | 0 refills | Status: DC | PRN

## 2017-12-22 MED ORDER — FAMOTIDINE IN NACL 20-0.9 MG/50ML-% IV SOLN
20.0000 mg | Freq: Two times a day (BID) | INTRAVENOUS | Status: DC
  Administered 2017-12-22: 20 mg via INTRAVENOUS
  Filled 2017-12-22 (×3): qty 50

## 2017-12-22 NOTE — Progress Notes (Signed)
[id number] - At bedside update CNM. Pt also reporting she has been feeling chest pain, tightness where it is hard to breath, denies any sob associated with chest pain. Pt says she does not feel winded or sob, just heaviness and tightness in chest, rates pain 8/10, position changes does not help relieve discomfort. Pt denies hx asthma, heartburn or indigestion. Per Hoover BrunetteJ. Gledhill, CNM, will order EKG to further evaluate pt.

## 2017-12-22 NOTE — Plan of Care (Signed)
NST reactive.

## 2017-12-22 NOTE — Consult Note (Signed)
Cardiology Consult    Patient ID: Krista Perkins MRN: [id number], DOB/AGE: 10/19/1995   Admit date: 12/20/2017 Date of Consult: 12/22/2017  Primary Physician: [facility] Primary Cardiologist: Yvonne Kendall, MD - New Requesting Provider: Edison Pace, MD  Patient Profile    Krista Perkins is a 22 y.o. female with a history of obesity and pregnancy x 2, currently [redacted] wks pregnant, who is being seen today for the evaluation of chest pain in the setting of UTI at the request of Dr. Jean Rosenthal.  Past Medical History   Past Medical History:  Diagnosis Date  . Chest pain    a. 11/2017 CTA Chest: No PE. No coronary/aortic Ca2+.  . Morbid obesity (HCC)     Past Surgical History:  Procedure Laterality Date  . NO PAST SURGERIES       Allergies  No Known Allergies  History of Present Illness    22 year old female without prior cardiac history.  She does have a family history of congestive heart failure, with her mom suffering from CHF at the age of 54.  Both parents are still alive.  Patient has a history of obesity and is currently [redacted] weeks pregnant.  This is her second pregnancy.  She reports that with her first pregnancy, she had problems with nausea, vomiting, chest and abdominal pain.  In June of this year, she was seen in the emergency department with chest pain.  EKG and lab work were unimpressive.  CT of the chest was negative for PE.  Of note, no coronary calcification or pericardial effusion were noted.  She says that since then, she has been experiencing right-sided flank/abdominal discomfort.  She says she is also been having nausea and vomiting and has been treated with oral antibiotics for UTIs but says that she has had trouble keeping them down.  Because of ongoing right-sided flank pain with nausea and vomiting, she presented to the emergency department early on the morning of July 8.  There, she was found to be mildly anemic and hypokalemic.  Urinalysis was negative.   Renal ultrasound was unremarkable.  She was admitted and started on IV fluids.  On the evening of July 8, she developed up to 8/10 substernal chest pressure and tightness without associated symptoms.  Pain is worse with certain position changes as well as palpation.  This is similar to what she experienced with her first pregnancy.  An EKG performed this morning shows sinus tachycardia with small inferolateral Q waves and left atrial enlargement.  This is similar to prior ECGs.  We have been asked to evaluate.  She continues to complain of 8/10 chest pain which is reproducible on exam with palpation.  Inpatient Medications      Scheduled Meds: Continuous Infusions: . 0.9 % NaCl with KCl 20 mEq / L 125 mL/hr at 12/22/17 0130  . famotidine (PEPCID) IV 20 mg (12/22/17 0849)   PRN Meds:.hydrOXYzine, morphine injection, morphine injection, ondansetron (ZOFRAN) IV   Family History    Family History  Problem Relation Age of Onset  . Diabetes Mother   . Heart failure Mother    indicated that her mother is alive. She indicated that her father is alive.   Social History    Social History   Socioeconomic History  . Marital status: Single    Spouse name: Not on file  . Number of children: Not on file  . Years of education: Not on file  . Highest education level: Not on  file  Occupational History  . Not on file  Social Needs  . Financial resource strain: Not on file  . Food insecurity:    Worry: Not on file    Inability: Not on file  . Transportation needs:    Medical: Not on file    Non-medical: Not on file  Tobacco Use  . Smoking status: Former Games developer  . Smokeless tobacco: Never Used  Substance and Sexual Activity  . Alcohol use: Not Currently  . Drug use: Never  . Sexual activity: Not Currently  Lifestyle  . Physical activity:    Days per week: Not on file    Minutes per session: Not on file  . Stress: Not on file  Relationships  . Social connections:    Talks on phone:  Not on file    Gets together: Not on file    Attends religious service: Not on file    Active member of club or organization: Not on file    Attends meetings of clubs or organizations: Not on file    Relationship status: Not on file  . Intimate partner violence:    Fear of current or ex partner: Not on file    Emotionally abused: Not on file    Physically abused: Not on file    Forced sexual activity: Not on file  Other Topics Concern  . Not on file  Social History Narrative   Lives in [place] with her mother.  Does not routinely exercise.     Review of Systems    General:  No chills, fever, night sweats or weight changes.  Cardiovascular:  +++ chest pain, no dyspnea on exertion, edema, orthopnea, palpitations, paroxysmal nocturnal dyspnea. Dermatological: No rash, lesions/masses Respiratory: No cough, dyspnea Urologic: No hematuria, dysuria Abdominal:   +++ R flank pain/tenderness, +++ nausea, +++ vomiting, no diarrhea, bright red blood per rectum, melena, or hematemesis Neurologic:  No visual changes, wkns, changes in mental status. All other systems reviewed and are otherwise negative except as noted above.  Physical Exam    Blood pressure 128/74, pulse (!) 113, temperature 99.7 F (37.6 C), temperature source Oral, resp. rate (!) 22, height 5\' 1"  (1.549 m), weight 245 lb (111.1 kg), SpO2 100 %.  General: Pleasant, NAD Psych: Normal affect. Neuro: Alert and oriented X 3. Moves all extremities spontaneously. HEENT: Normal  Neck: Supple without bruits.  Obese, difficult to gauge JVP. Lungs:  Resp regular and unlabored, CTA. Heart: RRR no s3, s4, or murmurs. Abdomen: Soft, non-tender, non-distended, BS + x 4.  Extremities: No clubbing, cyanosis or edema. DP/PT/Radials 2+ and equal bilaterally.  Labs     Recent Labs    12/22/17 0829  TROPONINI <0.03   Lab Results  Component Value Date   WBC 10.3 12/22/2017   HGB 10.2 (L) 12/22/2017   HCT 29.6 (L) 12/22/2017    MCV 87.7 12/22/2017   PLT 170 12/22/2017    Recent Labs  Lab 12/21/17 0321  NA 136  K 3.2*  CL 109  CO2 22  BUN <5*  CREATININE 0.43*  CALCIUM 8.3*  PROT 6.3*  BILITOT 0.6  ALKPHOS 112  ALT 14  AST 26  GLUCOSE 92    Radiology Studies    Dg Chest 2 View  Result Date: 11/29/2017 CLINICAL DATA:  Medial chest pain, nausea, and shortness of breath for 2 weeks. Patient is [redacted] weeks pregnant and was shielded. EXAM: CHEST - 2 VIEW COMPARISON:  12/16/2011 FINDINGS: The heart size and mediastinal  contours are within normal limits. Both lungs are clear. The visualized skeletal structures are unremarkable. IMPRESSION: No active cardiopulmonary disease. Electronically Signed   By: Burman NievesWilliam  Stevens M.D.   On: 11/29/2017 23:03   Ct Angio Chest Pe W/cm &/or Wo Cm  Result Date: 11/30/2017 CLINICAL DATA:  Medial chest pain, nausea, and shortness of breath for 2 weeks. Eight months pregnant and shielded. The scanner paused during the initial injection, resulting in a suboptimal contrast bolus. The patient was reinjected and re-scanned. EXAM: CT ANGIOGRAPHY CHEST WITH CONTRAST TECHNIQUE: Multidetector CT imaging of the chest was performed using the standard protocol during bolus administration of intravenous contrast. Multiplanar CT image reconstructions and MIPs were obtained to evaluate the vascular anatomy. CONTRAST:  75mL ISOVUE-370 IOPAMIDOL (ISOVUE-370) INJECTION 76% COMPARISON:  12/16/2011 FINDINGS: Cardiovascular: In spite of reinjection and rescanning of the patient, there is poor bolus timing resulting in a poor contrast bolus. Images are nondiagnostic for evaluation of the pulmonary arteries for pulmonary embolus. Normal caliber thoracic aorta without evidence of dissection. Great vessel origins are patent. Heart size is normal. No pericardial effusion. Mediastinum/Nodes: Small esophageal hiatal hernia. Esophagus is otherwise decompressed. No significant lymphadenopathy in the chest. Axillary lymph  nodes are not pathologically enlarged. Lungs/Pleura: Motion artifact limits evaluation. No consolidation or edema is suggested. No pleural effusions. No pneumothorax. Airways appear patent. Upper Abdomen: No focal acute abnormality is demonstrated. Musculoskeletal: No chest wall abnormality. No acute or significant osseous findings. Review of the MIP images confirms the above findings. IMPRESSION: 1. Due to poor contrast bolus timing, the examination is nondiagnostic for evaluation of the pulmonary arteries. 2. No evidence of active pulmonary disease. Electronically Signed   By: Burman NievesWilliam  Stevens M.D.   On: 11/30/2017 02:19   [place] Renal  Result Date: 12/21/2017 CLINICAL DATA:  Acute right flank pain. Patient reports right flank pain for 3 weeks. Pregnant patient in third trimester pregnancy. EXAM: RENAL / URINARY TRACT ULTRASOUND COMPLETE COMPARISON:  None. FINDINGS: Right Kidney: Length: 11.2 cm. Echogenicity within normal limits. No mass or hydronephrosis visualized. Left Kidney: Length: 12.2 cm. Echogenicity within normal limits. No mass or hydronephrosis visualized. Bladder: Appears normal for degree of bladder distention. Gravid uterus incidentally noted. IMPRESSION: Unremarkable renal ultrasound.  No hydronephrosis. Electronically Signed   By: Rubye OaksMelanie  Ehinger M.D.   On: 12/21/2017 05:47    ECG & Cardiac Imaging    Sinus tachycardia, 118, left atrial enlargement, small inferolateral Q waves, similar to prior ECGs.  Assessment & Plan    1.  Chest pain: Patient is [redacted] weeks pregnant and was admitted to [facility] early on July 8 with a several week history of right-sided flank pain and prior urinary tract infection, along with nausea and vomiting.  While admitted, she developed substernal chest discomfort that is worse with certain position changes and reproducible with palpation.  ECG shows sinus tachycardia with small inferolateral Q waves, largely similar to prior ECGs.  Troponin evaluated this  morning and normal.  Pain remains 8/10.  Echocardiogram pending.  Recommend monitoring on telemetry for the time being and following up another troponin.  Doubt acute coronary syndrome or ischemia given musculoskeletal nature and youth.  No coronary calcium of effusion on recent Chest CT.  Question role of nausea/vomiting/heaving and development of symptoms.  Provided the troponins remain normal and echo shows normal LV function, would not pursue any additional ischemic evaluation.  2.  H/o UTI: UA neg. Renal u/s unremarkable.  3.  N/V:  Prn zofran per primary  team.  4.  Anemia: follow.  5.  Hypokalemia:  K 3.2 yesterday.  Supp and f/u if not already done.   Signed, Nicolasa Ducking, NP 12/22/2017, 10:13 AM  For questions or updates, please contact   Please consult [email or web address] for contact info under Cardiology/STEMI.

## 2017-12-22 NOTE — Discharge Instructions (Signed)

## 2017-12-22 NOTE — Progress Notes (Signed)
At 0210 Pt c/o pain that radiates to her chest. At first she pointed to the right side then changed to the center of her chest. Pt sleeping soundly on previous assessment at 0130. VSS. 100% O2. Lung sounds clear, slightly tachycardic at 108 but has been since admission. Rates pain at a 10/10. Currently on her cell phone and giggling. Explained the severity of a 10/10 to ensure understanding of pain scale. Pt giggled and said "yea it's a 10". States she has not passed gas or had a BM in a while. Encouraged pt to lay on her left side and will suggest stool softeners. JGledhill, CNM notified of pt status and complaints. Will increase Morphine dose to 1-2mg  every 4 hours instead of 1mg . Will continue to assess.

## 2017-12-22 NOTE — Progress Notes (Signed)
The EKG was completed and signed by Dr. Darnelle CatalanMalinda in the Emergency Department. The EKG was signed on 12/22/2017 at 07:40.

## 2017-12-22 NOTE — Progress Notes (Signed)
[id number] - Pt into OBS room from M/B unit via WC for NST, admitted for r/o pyelo. Pt c/o abdominal discomfort and agrees she can wait until time for next scheduled dose.

## 2017-12-22 NOTE — Progress Notes (Signed)
*  PRELIMINARY RESULTS* Echocardiogram 2D Echocardiogram has been performed.  Krista Perkins, Krista Perkins 12/22/2017, 11:22 AM

## 2017-12-22 NOTE — Discharge Summary (Signed)
Physician Discharge Summary  Patient ID: Krista Perkins MRN: [id number] DOB/AGE: December 13, 1995 22 y.o.  Admit date: 12/20/2017 Discharge date: 12/22/2017  Admission Diagnoses: 1) Abdominal pain 2) Gastritis  Discharge Diagnoses:  1) Abdominal pain 2) Gastritis  Discharged Condition: good  Hospital Course: Patient admitted with abdominal pain, nausea and vomiting.  Patient started on IV antibiotics, urine culture returned negative (mutliple species consistent with contamination).  Also reported SOB with epigastric chest pain.  Cardiology work up after non-specific EKG changes noted included normal echo, normal cardiac enzymes.  The patient did have improvement in symptoms following GI cocktail and sucralfate.  Will continue on sucralfate and start omeprazole.    Consults: cardiology  Significant Diagnostic Studies:  Results for orders placed or performed during the hospital encounter of 12/20/17 (from the past 72 hour(s))  Urinalysis, Complete w Microscopic     Status: Abnormal   Collection Time: 12/21/17 12:27 AM  Result Value Ref Range   Color, Urine YELLOW (A) YELLOW   APPearance CLEAR (A) CLEAR   Specific Gravity, Urine 1.009 1.005 - 1.030   pH 7.0 5.0 - 8.0   Glucose, UA NEGATIVE NEGATIVE mg/dL   Hgb urine dipstick NEGATIVE NEGATIVE   Bilirubin Urine NEGATIVE NEGATIVE   Ketones, ur 20 (A) NEGATIVE mg/dL   Protein, ur NEGATIVE NEGATIVE mg/dL   Nitrite NEGATIVE NEGATIVE   Leukocytes, UA TRACE (A) NEGATIVE   RBC / HPF 0-5 0 - 5 RBC/hpf   WBC, UA 6-10 0 - 5 WBC/hpf   Bacteria, UA RARE (A) NONE SEEN   Squamous Epithelial / LPF 0-5 0 - 5   Mucus PRESENT     Comment: Performed at [facility], [address], [place], [place] [address]  Urine culture     Status: Abnormal   Collection Time: 12/21/17 12:27 AM  Result Value Ref Range   Specimen Description      URINE, CLEAN CATCH Performed at [facility], [address] [place], [place] [address]    Special  Requests      NONE Performed at [facility], [address] [place], [place] [address]    Culture MULTIPLE SPECIES PRESENT, SUGGEST RECOLLECTION (A)    Report Status 12/22/2017 FINAL   Culture, beta strep (group b only)     Status: None (Preliminary result)   Collection Time: 12/21/17  3:07 AM  Result Value Ref Range   Specimen Description      VAGINAL/RECTAL Performed at [facility], [address], [place], [place] [address]    Special Requests      NONE Performed at [facility], [address], [place], [place] [address]    Culture      CULTURE REINCUBATED FOR BETTER GROWTH Performed at [facility], [address] [place], [place] [address]    Report Status PENDING   CBC     Status: Abnormal   Collection Time: 12/21/17  3:21 AM  Result Value Ref Range   WBC 9.8 3.6 - 11.0 K/uL   RBC 3.38 (L) 3.80 - 5.20 MIL/uL   Hemoglobin 9.9 (L) 12.0 - 16.0 g/dL   HCT 29.3 (L) 35.0 - 47.0 %   MCV 86.7 80.0 - 100.0 fL   MCH 29.5 26.0 - 34.0 pg   MCHC 34.0 32.0 - 36.0 g/dL   RDW 14.3 11.5 - 14.5 %   Platelets 189 150 - 440 K/uL    Comment: Performed at [facility], [address]  [place], [place] [address]  Comprehensive metabolic panel     Status: Abnormal   Collection Time: 12/21/17  3:21 AM  Result Value Ref Range   Sodium 136 135 - 145 mmol/L   Potassium 3.2 (L) 3.5 - 5.1 mmol/L   Chloride 109 98 - 111 mmol/L    Comment: Please note change in reference range.   CO2 22 22 - 32 mmol/L   Glucose, Bld 92 70 - 99 mg/dL    Comment: Please note change in reference range.   BUN <5 (L) 6 - 20 mg/dL    Comment: Please note change in reference range.   Creatinine, Ser 0.43 (L) 0.44 - 1.00 mg/dL   Calcium 8.3 (L) 8.9 - 10.3 mg/dL   Total Protein 6.3 (L) 6.5 - 8.1 g/dL   Albumin 2.4 (L) 3.5 - 5.0 g/dL   AST 26 15 - 41 U/L   ALT 14 0 - 44 U/L    Comment: Please note change in reference range.   Alkaline Phosphatase 112 38 - 126 U/L    Total Bilirubin 0.6 0.3 - 1.2 mg/dL   GFR calc non Af Amer >60 >60 mL/min   GFR calc Af Amer >60 >60 mL/min    Comment: (NOTE) The eGFR has been calculated using the CKD EPI equation. This calculation has not been validated in all clinical situations. eGFR's persistently <60 mL/min signify possible Chronic Kidney Disease.    Anion gap 5 5 - 15    Comment: Performed at [facility], [address] [place], [place] [address]  Lipase, blood     Status: None   Collection Time: 12/21/17  3:21 AM  Result Value Ref Range   Lipase 19 11 - 51 U/L    Comment: Performed at [facility], [address] [place], [place] [address]  CBC     Status: Abnormal   Collection Time: 12/22/17  4:56 AM  Result Value Ref Range   WBC 10.3 3.6 - 11.0 K/uL   RBC 3.38 (L) 3.80 - 5.20 MIL/uL   Hemoglobin 10.2 (L) 12.0 - 16.0 g/dL   HCT 29.6 (L) 35.0 - 47.0 %   MCV 87.7 80.0 - 100.0 fL   MCH 30.2 26.0 - 34.0 pg   MCHC 34.4 32.0 - 36.0 g/dL   RDW 14.3 11.5 - 14.5 %   Platelets 170 150 - 440 K/uL    Comment: Performed at [facility], [address] [place], [place] [address]  Troponin I     Status: None   Collection Time: 12/22/17  8:29 AM  Result Value Ref Range   Troponin I <0.03 <0.03 ng/mL    Comment: Performed at [facility], [address] [place], [place] [address]  Urine Drug Screen, Qualitative ([place] only)     Status: Abnormal   Collection Time: 12/22/17 10:16 AM  Result Value Ref Range   Tricyclic, Ur Screen NONE DETECTED NONE DETECTED   Amphetamines, Ur Screen NONE DETECTED NONE DETECTED   MDMA (Ecstasy)Ur Screen NONE DETECTED NONE DETECTED   Cocaine Metabolite,Ur [place] NONE DETECTED NONE DETECTED   Opiate, Ur Screen POSITIVE (A) NONE DETECTED   Phencyclidine (PCP) Ur S NONE DETECTED NONE DETECTED   Cannabinoid 50 Ng, Ur [place] NONE DETECTED NONE DETECTED   Barbiturates, Ur Screen (A) NONE DETECTED    Result not available. Reagent lot number recalled  by manufacturer.   Benzodiazepine, Ur Scrn NONE DETECTED NONE DETECTED   Methadone Scn, Ur NONE DETECTED NONE  DETECTED    Comment: (NOTE) Tricyclics + metabolites, urine    Cutoff 1000 ng/mL Amphetamines + metabolites, urine  Cutoff 1000 ng/mL MDMA (Ecstasy), urine              Cutoff 500 ng/mL Cocaine Metabolite, urine          Cutoff 300 ng/mL Opiate + metabolites, urine        Cutoff 300 ng/mL Phencyclidine (PCP), urine         Cutoff 25 ng/mL Cannabinoid, urine                 Cutoff 50 ng/mL Barbiturates + metabolites, urine  Cutoff 200 ng/mL Benzodiazepine, urine              Cutoff 200 ng/mL Methadone, urine                   Cutoff 300 ng/mL The urine drug screen provides only a preliminary, unconfirmed analytical test result and should not be used for non-medical purposes. Clinical consideration and professional judgment should be applied to any positive drug screen result due to possible interfering substances. A more specific alternate chemical method must be used in order to obtain a confirmed analytical result. Gas chromatography / mass spectrometry (GC/MS) is the preferred confirmat ory method. Performed at [facility], [address] [place], [place] [address]   Troponin I     Status: None   Collection Time: 12/22/17  2:33 PM  Result Value Ref Range   Troponin I <0.03 <0.03 ng/mL    Comment: Performed at [facility], [address] [place], [place] [address]  Basic metabolic panel     Status: Abnormal   Collection Time: 12/22/17  2:33 PM  Result Value Ref Range   Sodium 133 (L) 135 - 145 mmol/L   Potassium 3.5 3.5 - 5.1 mmol/L   Chloride 107 98 - 111 mmol/L    Comment: Please note change in reference range.   CO2 19 (L) 22 - 32 mmol/L   Glucose, Bld 107 (H) 70 - 99 mg/dL    Comment: Please note change in reference range.   BUN <5 (L) 6 - 20 mg/dL    Comment: Please note change in reference range.   Creatinine, Ser 0.37 (L) 0.44 - 1.00  mg/dL   Calcium 8.5 (L) 8.9 - 10.3 mg/dL   GFR calc non Af Amer >60 >60 mL/min   GFR calc Af Amer >60 >60 mL/min    Comment: (NOTE) The eGFR has been calculated using the CKD EPI equation. This calculation has not been validated in all clinical situations. eGFR's persistently <60 mL/min signify possible Chronic Kidney Disease.    Anion gap 7 5 - 15    Comment: Performed at [facility], [address] [place], [place] [address]   Dg Chest 2 View  Result Date: 11/29/2017 CLINICAL DATA:  Medial chest pain, nausea, and shortness of breath for 2 weeks. Patient is [redacted] weeks pregnant and was shielded. EXAM: CHEST - 2 VIEW COMPARISON:  12/16/2011 FINDINGS: The heart size and mediastinal contours are within normal limits. Both lungs are clear. The visualized skeletal structures are unremarkable. IMPRESSION: No active cardiopulmonary disease. Electronically Signed   By: Lucienne Capers M.D.   On: 11/29/2017 23:03   Ct Angio Chest Pe W/cm &/or Wo Cm  Result Date: 11/30/2017 CLINICAL DATA:  Medial chest pain, nausea, and shortness of breath for 2 weeks. Eight months pregnant and shielded. The  scanner paused during the initial injection, resulting in a suboptimal contrast bolus. The patient was reinjected and re-scanned. EXAM: CT ANGIOGRAPHY CHEST WITH CONTRAST TECHNIQUE: Multidetector CT imaging of the chest was performed using the standard protocol during bolus administration of intravenous contrast. Multiplanar CT image reconstructions and MIPs were obtained to evaluate the vascular anatomy. CONTRAST:  70m ISOVUE-370 IOPAMIDOL (ISOVUE-370) INJECTION 76% COMPARISON:  12/16/2011 FINDINGS: Cardiovascular: In spite of reinjection and rescanning of the patient, there is poor bolus timing resulting in a poor contrast bolus. Images are nondiagnostic for evaluation of the pulmonary arteries for pulmonary embolus. Normal caliber thoracic aorta without evidence of dissection. Great vessel origins are  patent. Heart size is normal. No pericardial effusion. Mediastinum/Nodes: Small esophageal hiatal hernia. Esophagus is otherwise decompressed. No significant lymphadenopathy in the chest. Axillary lymph nodes are not pathologically enlarged. Lungs/Pleura: Motion artifact limits evaluation. No consolidation or edema is suggested. No pleural effusions. No pneumothorax. Airways appear patent. Upper Abdomen: No focal acute abnormality is demonstrated. Musculoskeletal: No chest wall abnormality. No acute or significant osseous findings. Review of the MIP images confirms the above findings. IMPRESSION: 1. Due to poor contrast bolus timing, the examination is nondiagnostic for evaluation of the pulmonary arteries. 2. No evidence of active pulmonary disease. Electronically Signed   By: WLucienne CapersM.D.   On: 11/30/2017 02:19   [place]  Result Date: 12/21/2017 CLINICAL DATA:  Acute right flank pain. Patient reports right flank pain for 3 weeks. Pregnant patient in third trimester pregnancy. EXAM: RENAL / URINARY TRACT ULTRASOUND COMPLETE COMPARISON:  None. FINDINGS: Right Kidney: Length: 11.2 cm. Echogenicity within normal limits. No mass or hydronephrosis visualized. Left Kidney: Length: 12.2 cm. Echogenicity within normal limits. No mass or hydronephrosis visualized. Bladder: Appears normal for degree of bladder distention. Gravid uterus incidentally noted. IMPRESSION: Unremarkable renal ultrasound.  No hydronephrosis. Electronically Signed   By: MJeb LeveringM.D.   On: 12/21/2017 05:47   Treatments: IV antibiotics, antiemetics, and IV fluids  Discharge Exam: Blood pressure (!) 112/57, pulse (!) 114, temperature 98.7 F (37.1 C), temperature source Oral, resp. rate (!) 22, height _0  (1.549 m), weight 245 lb (111.1 kg), SpO2 100 %. General appearance: alert, appears stated age and no distress Resp: no increased work of breathing GI: gravid, soft, non-tender  Disposition: Discharge disposition:  01-Home or Self Care       Discharge Instructions    Discharge activity:  No Restrictions   Complete by:  As directed    Discharge diet:  No restrictions   Complete by:  As directed    No sexual activity restrictions   Complete by:  As directed    Notify physician for a general feeling that "something is not right"   Complete by:  As directed    Notify physician for increase or change in vaginal discharge   Complete by:  As directed    Notify physician for intestinal cramps, with or without diarrhea, sometimes described as "gas pain"   Complete by:  As directed    Notify physician for leaking of fluid   Complete by:  As directed    Notify physician for low, dull backache, unrelieved by heat or Tylenol   Complete by:  As directed    Notify physician for menstrual like cramps   Complete by:  As directed    Notify physician for pelvic pressure   Complete by:  As directed    Notify physician for uterine contractions.  These may be painless  and feel like the uterus is tightening or the baby is  "balling up"   Complete by:  As directed    Notify physician for vaginal bleeding   Complete by:  As directed    PRETERM LABOR:  Includes any of the follwing symptoms that occur between 20 - [redacted] weeks gestation.  If these symptoms are not stopped, preterm labor can result in preterm delivery, placing your baby at risk   Complete by:  As directed      Allergies as of 12/22/2017   No Known Allergies     Medication List    TAKE these medications   omeprazole 40 MG capsule Commonly known as:  PRILOSEC Take 1 capsule (40 mg total) by mouth daily.   ondansetron 4 MG disintegrating tablet Commonly known as:  ZOFRAN ODT Take 1 tablet (4 mg total) by mouth every 6 (six) hours as needed for nausea.   prenatal multivitamin Tabs tablet Take 1 tablet by mouth daily at 12 noon.   sucralfate 1 GM/10ML suspension Commonly known as:  CARAFATE Take 10 mLs (1 g total) by mouth 4 (four) times daily  -  with meals and at bedtime.      [facility] Follow up.   Why:  keep regular appt. Contact information: [address] [place] [address] [phone]           Signed: Malachy Mood 12/22/2017, 6:50 PM

## 2017-12-22 NOTE — Progress Notes (Signed)
Spoke with Hoover BrunetteJ. Gledhill, CNM, made aware pt in OBS for NST. Reports sharp pain in abdomen that comes. FHT with moderate variability, initial FHT 180's tachy and gradually coming down and settling in 150's with accels. Hoover BrunetteJ. Gledhill, CNM to come assess pt before her return to M/B unit.

## 2017-12-22 NOTE — Progress Notes (Signed)
Patient in Obs 1 for daily NST. She states her pain level is 8/10 and is primarily in her chest. The chest pain began overnight. It is in her sternum and it is constant. The night shift nurse notified me of the pain and we discussed changing her morphine dose to 1-2 mg morphine q 4 hours instead of 1 mg. This helped the patient initially. She was seen approximately 3 weeks ago for chest pain. EKG then was normal sinus rhythm with sinus arrhythmia/T wave abnormality- Abnormal ECG.   Imaging 3 weeks ago was unremarkable and she had a normal troponin level. Her chest pain improved with IV Pepcid while in the ED. She was discharged home from the ED with a prescription of zofran and strict return precautions.  She continues to have intermittent lower right sided abdominal and lower right sided back pain. She describes that pain as every 20 minutes and lasting for a minute. There appears to be some contraction activity on the [place]. Baby initially was tachy in the 180s but baseline came down to 150s with good variability. There are accelerations and no decelerations.   EKG today is abnormal and Dr Darnelle CatalanMalinda recommends cardiology consult.  [facility] consult ordered. Spoke with Dr End who will be up to see the patient.   Tresea MallJane Deedra Pro, CNM

## 2017-12-22 NOTE — Progress Notes (Signed)
To L&D for an NST

## 2017-12-23 ENCOUNTER — Emergency Department: Payer: Medicaid Other

## 2017-12-23 ENCOUNTER — Emergency Department
Admission: EM | Admit: 2017-12-23 | Discharge: 2017-12-23 | Disposition: A | Discharge status: Home / Self Care | Payer: Medicaid Other | Attending: Student in an Organized Health Care Education/Training Program | Admitting: Student in an Organized Health Care Education/Training Program

## 2017-12-23 ENCOUNTER — Other Ambulatory Visit: Payer: Self-pay

## 2017-12-23 ENCOUNTER — Observation Stay
Admission: EM | Admit: 2017-12-23 | Discharge: 2017-12-23 | Disposition: A | Discharge status: Still In Hospital | Payer: Medicaid Other | Attending: Obstetrics and Gynecology | Admitting: Obstetrics and Gynecology

## 2017-12-23 DIAGNOSIS — Z79899 Other long term (current) drug therapy: Secondary | ICD-10-CM | POA: Insufficient documentation

## 2017-12-23 DIAGNOSIS — O9989 Other specified diseases and conditions complicating pregnancy, childbirth and the puerperium: Secondary | ICD-10-CM | POA: Insufficient documentation

## 2017-12-23 DIAGNOSIS — M79605 Pain in left leg: Secondary | ICD-10-CM | POA: Insufficient documentation

## 2017-12-23 DIAGNOSIS — W1830XA Fall on same level, unspecified, initial encounter: Secondary | ICD-10-CM | POA: Insufficient documentation

## 2017-12-23 DIAGNOSIS — O26893 Other specified pregnancy related conditions, third trimester: Principal | ICD-10-CM | POA: Insufficient documentation

## 2017-12-23 DIAGNOSIS — O1203 Gestational edema, third trimester: Secondary | ICD-10-CM | POA: Diagnosis not present

## 2017-12-23 DIAGNOSIS — Z87891 Personal history of nicotine dependence: Secondary | ICD-10-CM | POA: Diagnosis not present

## 2017-12-23 DIAGNOSIS — Z3A36 36 weeks gestation of pregnancy: Secondary | ICD-10-CM | POA: Insufficient documentation

## 2017-12-23 DIAGNOSIS — M79604 Pain in right leg: Secondary | ICD-10-CM | POA: Diagnosis not present

## 2017-12-23 DIAGNOSIS — M25562 Pain in left knee: Secondary | ICD-10-CM | POA: Insufficient documentation

## 2017-12-23 LAB — CULTURE, BETA STREP (GROUP B ONLY)

## 2017-12-23 MED ORDER — ACETAMINOPHEN 325 MG PO TABS: 650.0000 mg | ORAL_TABLET | ORAL | Status: DC | PRN

## 2017-12-23 MED ORDER — ACETAMINOPHEN 500 MG PO TABS
1000.0000 mg | ORAL_TABLET | Freq: Once | ORAL | Status: AC
  Administered 2017-12-23: 1000 mg via ORAL
  Filled 2017-12-23: qty 2

## 2017-12-23 NOTE — ED Notes (Signed)
Patient transported to Ultrasound 

## 2017-12-23 NOTE — Progress Notes (Signed)
Pt arrived via WC to BP in no acute distress. Pt sister stated she fell about 30 mins ago prior to coming into hospital while trying to use BR. Says she did not hit her abdomen, fell on her bottom. States her reason for coming to hospital is because she is having swelling with Lt leg pain up to knee cap and having difficulty ambulating or lifting left leg, constant, aching, sharp and rates 10/10. States pain in Rt leg is tolerable but pain in Lt leg is worse. Has not tried anything to relieve the leg pain. Noticed rash all over body since taking abx for UTI this past Sunday. Also c/o not feeling baby move much since she was last in triage. Felt baby move 1-2X since [facility]. Has not had a BM in a week. Denies chest pain, fever, chills, urinary symptoms. No abdominal pain or contractions, vaginal bleeding or leaking fluid.

## 2017-12-23 NOTE — Progress Notes (Signed)
Spoke with Dr Bonney AidStaebler about pt arrival to triage and c/o fall, leg pain and decrease fetal mvmt. States obtain reactive NST, then pt may be discharged to ER to be further evaluated for Lt leg pain. Recommended otc stool softner for bowels and hydrocortisone or Benadryl for itching. Communicated recommendations to pt.

## 2017-12-23 NOTE — Discharge Summary (Signed)
See final progress note. 

## 2017-12-23 NOTE — ED Triage Notes (Signed)
Pt states "my legs hurt to stand on them". Pt complains of bilateral leg pain with standing. Pt states she is [redacted] weeks pregnant, edd 01/18/2018. Pt was seen in ob this am for abd pain and cleared to ed for treatment of leg pain. Pt denies fever, shob. Pt appears in no acute distress.

## 2017-12-23 NOTE — ED Notes (Signed)
No fetal heart tones done on this visit, as patient was evaluated in L&D prior to coming back down to ER.

## 2017-12-23 NOTE — ED Provider Notes (Signed)
[facility] Emergency Department Provider Note    First MD Initiated Contact with Patient 12/23/17 [phone]     (approximate)  I have reviewed the triage vital signs and the nursing notes.   HISTORY  Chief Complaint Leg Pain    HPI Krista Perkins is a 22 y.o. female G3P1 who presents to the ER for evaluation of bilateral leg cramping.  Patient is coming from labor and delivery floor after she has had extensive evaluation for abdominal pain in third trimester.  No evidence of labor.  Symptoms most consistent with GERD.  Patient states that she has been having crampy aching sensation in both lower legs with some swelling.  Denies any shortness of breath.  No chest pain.  Denies any history of blood clots.     Past Medical History:  Diagnosis Date  . Chest pain    a. 11/2017 CTA Chest: No PE. No coronary/aortic Ca2+.  . Morbid obesity (HCC)    Family History  Problem Relation Age of Onset  . Diabetes Mother   . Heart failure Mother    Past Surgical History:  Procedure Laterality Date  . NO PAST SURGERIES     Patient Active Problem List   Diagnosis Date Noted  . Labor and delivery indication for care or intervention 12/23/2017  . Indication for care in labor and delivery, antepartum 12/21/2017  . Pyelonephritis 12/21/2017  . Pregnancy 12/12/2017  . Decreased fetal movement 12/12/2017      Prior to Admission medications   Medication Sig Start Date End Date Taking? Authorizing Provider  omeprazole (PRILOSEC) 40 MG capsule Take 1 capsule (40 mg total) by mouth daily. 12/22/17   Vena Austria, MD  ondansetron (ZOFRAN ODT) 4 MG disintegrating tablet Take 1 tablet (4 mg total) by mouth every 6 (six) hours as needed for nausea. 12/22/17   Vena Austria, MD  Prenatal Vit-Fe Fumarate-FA (PRENATAL MULTIVITAMIN) TABS tablet Take 1 tablet by mouth daily at 12 noon.    [provider]  sucralfate (CARAFATE) 1 GM/10ML suspension Take 10 mLs (1 g  total) by mouth 4 (four) times daily -  with meals and at bedtime. 12/22/17   Vena Austria, MD    Allergies Patient has no known allergies.    Social History Social History   Tobacco Use  . Smoking status: Former Games developer  . Smokeless tobacco: Never Used  Substance Use Topics  . Alcohol use: Not Currently  . Drug use: Never    Review of Systems Patient denies headaches, rhinorrhea, blurry vision, numbness, shortness of breath, chest pain, edema, cough, abdominal pain, nausea, vomiting, diarrhea, dysuria, fevers, rashes or hallucinations unless otherwise stated above in HPI. ____________________________________________   PHYSICAL EXAM:  VITAL SIGNS: Vitals:   12/23/17 0530 12/23/17 0650  BP: (!) 96/44 (!) 103/54  Pulse: (!) 112 (!) 111  Resp: 20 20  Temp:    SpO2: 97% 98%    Constitutional: Alert and oriented.  Eyes: Conjunctivae are normal.  Head: Atraumatic. Nose: No congestion/rhinnorhea. Mouth/Throat: Mucous membranes are moist.   Neck: No stridor. Painless ROM.  Cardiovascular: Normal rate, regular rhythm. Grossly normal heart sounds.  Good peripheral circulation. Respiratory: Normal respiratory effort.  No retractions. Lungs CTAB. Gastrointestinal: gravid soft and nontender. No distention. No abdominal bruits. No CVA tenderness. Genitourinary: deferred Musculoskeletal: BLE edema. No cellulitic changes  No joint effusions. Neurologic:  Normal speech and language. No gross focal neurologic deficits are appreciated. No facial droop Skin:  Skin is warm, dry  and intact. No rash noted. Psychiatric: Mood and affect are normal. Speech and behavior are normal.  ____________________________________________   LABS (all labs ordered are listed, but only abnormal results are displayed)  Results for orders placed or performed during the hospital encounter of 12/20/17 (from the past 24 hour(s))  Troponin I     Status: None   Collection Time: 12/22/17  8:29 AM  Result  Value Ref Range   Troponin I <0.03 <0.03 ng/mL  Urine Drug Screen, Qualitative ([facility] only)     Status: Abnormal   Collection Time: 12/22/17 10:16 AM  Result Value Ref Range   Tricyclic, Ur Screen NONE DETECTED NONE DETECTED   Amphetamines, Ur Screen NONE DETECTED NONE DETECTED   MDMA (Ecstasy)Ur Screen NONE DETECTED NONE DETECTED   Cocaine Metabolite,Ur [place] NONE DETECTED NONE DETECTED   Opiate, Ur Screen POSITIVE (A) NONE DETECTED   Phencyclidine (PCP) Ur S NONE DETECTED NONE DETECTED   Cannabinoid 50 Ng, Ur [place] NONE DETECTED NONE DETECTED   Barbiturates, Ur Screen (A) NONE DETECTED    Result not available. Reagent lot number recalled by manufacturer.   Benzodiazepine, Ur Scrn NONE DETECTED NONE DETECTED   Methadone Scn, Ur NONE DETECTED NONE DETECTED  Troponin I     Status: None   Collection Time: 12/22/17  2:33 PM  Result Value Ref Range   Troponin I <0.03 <0.03 ng/mL  Basic metabolic panel     Status: Abnormal   Collection Time: 12/22/17  2:33 PM  Result Value Ref Range   Sodium 133 (L) 135 - 145 mmol/L   Potassium 3.5 3.5 - 5.1 mmol/L   Chloride 107 98 - 111 mmol/L   CO2 19 (L) 22 - 32 mmol/L   Glucose, Bld 107 (H) 70 - 99 mg/dL   BUN <5 (L) 6 - 20 mg/dL   Creatinine, Ser 1.610.37 (L) 0.44 - 1.00 mg/dL   Calcium 8.5 (L) 8.9 - 10.3 mg/dL   GFR calc non Af Amer >60 >60 mL/min   GFR calc Af Amer >60 >60 mL/min   Anion gap 7 5 - 15   ____________________________________________ ____________________________________________  RADIOLOGY  I personally reviewed all radiographic images ordered to evaluate for the above acute complaints and reviewed radiology reports and findings.  These findings were personally discussed with the patient.  Please see medical record for radiology report.  ____________________________________________   PROCEDURES  Procedure(s) performed:  Procedures    Critical Care performed: no ____________________________________________   INITIAL  IMPRESSION / ASSESSMENT AND PLAN / ED COURSE  Pertinent labs & imaging results that were available during my care of the patient were reviewed by me and considered in my medical decision making (see chart for details).   DDX: dvt, edema, venous stasis  Krista Perkins is a [age] y.o. who presents to the ED with bilateral leg cramping and swelling as described above in pregnancy.  Patient afebrile and otherwise hemodynamically stable.  No evidence of infectious process.  Ultrasound ordered to exclude DVT.  Clinical Course as of Dec 24 706  Wed Dec 23, 2017  0705 Ultrasound shows no evidence of DVT.  Patient stable and appropriate for discharge home.   [PR]    Clinical Course User Index [PR] Willy Eddyobinson, Yuridiana Formanek, MD     As part of my medical decision making, I reviewed the following data within the electronic MEDICAL RECORD NUMBER Nursing notes reviewed and incorporated, Labs reviewed, notes from prior ED visits.  ____________________________________________   FINAL CLINICAL IMPRESSION(S) / ED  DIAGNOSES  Final diagnoses:  Bilateral leg pain  Leg swelling in pregnancy in third trimester      NEW MEDICATIONS STARTED DURING THIS VISIT:  New Prescriptions   No medications on file     Note:  This document was prepared using Dragon voice recognition software and may include unintentional dictation errors.    Willy Eddy, MD 12/23/17 [phone]

## 2017-12-23 NOTE — Final Progress Note (Signed)
Physician Final Progress Note  Patient ID: Krista Perkins MRN: [id number] DOB/AGE: 22/12/1995 22 y.o.  Admit date: 12/23/2017 Admitting provider: Vena AustriaAndreas Alondra Vandeven, MD Discharge date: 12/23/2017   Admission Diagnoses: Fall, decreased fetal movement, constipation, rash  Discharge Diagnoses:  Active Problems:   Labor and delivery indication for care or intervention  22 year old G3P1 at 877w2d presenting after ground level fall, no abdominal trauma but significant knee pain per nursing staff.  Only two episodes of fetal movement noted since discharge this AM for work up of epigastric/chest pain.  She has had 36-hrs of continuous and reassuring fetal monitoring during that particular encounter.  No LOF, no VB, no ctx.  Discharged after reactive NST for further evaluation of her orthopedic injury in the ED.    Consults: None  Significant Findings/ Diagnostic Studies: none  Procedures: Baseline: 145 Variability: moderate Accelerations: present Decelerations: absent Tocometry: none The patient was monitored for 30 minutes, fetal heart rate tracing was deemed reactive, category I tracing,  Discharge Condition: good  Disposition: Discharge disposition: 01-Home or Self Care       Diet: Regular diet  Discharge Activity: Activity as tolerated  Discharge Instructions    Discharge activity:  No Restrictions   Complete by:  As directed    Discharge diet:  No restrictions   Complete by:  As directed    Fetal Kick Count:  Lie on our left side for one hour after a meal, and count the number of times your baby kicks.  If it is less than 5 times, get up, move around and drink some juice.  Repeat the test 30 minutes later.  If it is still less than 5 kicks in an hour, notify your doctor.   Complete by:  As directed    LABOR:  When conractions begin, you should start to time them from the beginning of one contraction to the beginning  of the next.  When contractions are 5 - 10 minutes apart or  less and have been regular for at least an hour, you should call your health care provider.   Complete by:  As directed    No sexual activity restrictions   Complete by:  As directed    Notify physician for bleeding from the vagina   Complete by:  As directed    Notify physician for blurring of vision or spots before the eyes   Complete by:  As directed    Notify physician for chills or fever   Complete by:  As directed    Notify physician for fainting spells, "black outs" or loss of consciousness   Complete by:  As directed    Notify physician for increase in vaginal discharge   Complete by:  As directed    Notify physician for leaking of fluid   Complete by:  As directed    Notify physician for pain or burning when urinating   Complete by:  As directed    Notify physician for pelvic pressure (sudden increase)   Complete by:  As directed    Notify physician for severe or continued nausea or vomiting   Complete by:  As directed    Notify physician for sudden gushing of fluid from the vagina (with or without continued leaking)   Complete by:  As directed    Notify physician for sudden, constant, or occasional abdominal pain   Complete by:  As directed    Notify physician if baby moving less than usual   Complete by:  As directed      Allergies as of 12/23/2017   No Known Allergies     Medication List    TAKE these medications   omeprazole 40 MG capsule Commonly known as:  PRILOSEC Take 1 capsule (40 mg total) by mouth daily.   ondansetron 4 MG disintegrating tablet Commonly known as:  ZOFRAN ODT Take 1 tablet (4 mg total) by mouth every 6 (six) hours as needed for nausea.   prenatal multivitamin Tabs tablet Take 1 tablet by mouth daily at 12 noon.   sucralfate 1 GM/10ML suspension Commonly known as:  CARAFATE Take 10 mLs (1 g total) by mouth 4 (four) times daily -  with meals and at bedtime.      Follow-up Information    [facility], [place]. Schedule an  appointment as soon as possible for a visit in 1 week(s).   Contact information: [address] [place] [address] [phone]           Triaged remotely via telephone and nursing staff  Signed: Vena Austria 12/23/2017, 3:23 AM

## 2017-12-24 ENCOUNTER — Other Ambulatory Visit: Payer: Self-pay | Admitting: Obstetrics and Gynecology

## 2017-12-24 DIAGNOSIS — R112 Nausea with vomiting, unspecified: Secondary | ICD-10-CM

## 2017-12-24 DIAGNOSIS — R1013 Epigastric pain: Secondary | ICD-10-CM | POA: Insufficient documentation

## 2017-12-28 ENCOUNTER — Encounter: Payer: Self-pay | Admitting: Obstetrics & Gynecology

## 2018-01-03 ENCOUNTER — Emergency Department
Admission: EM | Admit: 2018-01-03 | Discharge: 2018-01-03 | Disposition: A | Discharge status: Home / Self Care | Payer: Medicaid Other | Attending: Emergency Medicine | Admitting: Emergency Medicine

## 2018-01-03 ENCOUNTER — Other Ambulatory Visit: Payer: Self-pay

## 2018-01-03 DIAGNOSIS — E876 Hypokalemia: Secondary | ICD-10-CM | POA: Diagnosis not present

## 2018-01-03 DIAGNOSIS — M25551 Pain in right hip: Secondary | ICD-10-CM

## 2018-01-03 DIAGNOSIS — A770 Spotted fever due to Rickettsia rickettsii: Secondary | ICD-10-CM | POA: Insufficient documentation

## 2018-01-03 DIAGNOSIS — O9989 Other specified diseases and conditions complicating pregnancy, childbirth and the puerperium: Secondary | ICD-10-CM | POA: Insufficient documentation

## 2018-01-03 DIAGNOSIS — Z87891 Personal history of nicotine dependence: Secondary | ICD-10-CM | POA: Diagnosis not present

## 2018-01-03 DIAGNOSIS — Z79899 Other long term (current) drug therapy: Secondary | ICD-10-CM | POA: Insufficient documentation

## 2018-01-03 DIAGNOSIS — Z3A37 37 weeks gestation of pregnancy: Secondary | ICD-10-CM

## 2018-01-03 DIAGNOSIS — R52 Pain, unspecified: Secondary | ICD-10-CM | POA: Diagnosis present

## 2018-01-03 LAB — CBC WITH DIFFERENTIAL/PLATELET
BASOS ABS: 0 10*3/uL (ref 0–0.1)
BLASTS: 0 %
Band Neutrophils: 1 %
Basophils Relative: 0 %
EOS ABS: 0 10*3/uL (ref 0–0.7)
EOS PCT: 0 %
HCT: 27.9 % — ABNORMAL LOW (ref 35.0–47.0)
Hemoglobin: 9.5 g/dL — ABNORMAL LOW (ref 12.0–16.0)
LYMPHS ABS: 2.9 10*3/uL (ref 1.0–3.6)
Lymphocytes Relative: 16 %
MCH: 28.7 pg (ref 26.0–34.0)
MCHC: 34.1 g/dL (ref 32.0–36.0)
MCV: 84.2 fL (ref 80.0–100.0)
METAMYELOCYTES PCT: 0 %
MYELOCYTES: 0 %
Monocytes Absolute: 0.7 10*3/uL (ref 0.2–0.9)
Monocytes Relative: 4 %
Neutro Abs: 14.6 10*3/uL — ABNORMAL HIGH (ref 1.4–6.5)
Neutrophils Relative %: 79 %
Other: 0 %
PLATELETS: 278 10*3/uL (ref 150–440)
Promyelocytes Relative: 0 %
RBC: 3.31 MIL/uL — AB (ref 3.80–5.20)
RDW: 15.1 % — ABNORMAL HIGH (ref 11.5–14.5)
WBC: 18.2 10*3/uL — AB (ref 3.6–11.0)
nRBC: 1 /100 WBC — ABNORMAL HIGH

## 2018-01-03 LAB — BASIC METABOLIC PANEL
ANION GAP: 9 (ref 5–15)
BUN: <5 mg/dL — ABNORMAL LOW (ref 6–20)
CALCIUM: 7.9 mg/dL — AB (ref 8.9–10.3)
CO2: 25 mmol/L (ref 22–32)
Chloride: 103 mmol/L (ref 98–111)
Creatinine, Ser: 0.46 mg/dL (ref 0.44–1.00)
GFR calc Af Amer: >60 mL/min (ref >60)
GFR calc non Af Amer: >60 mL/min (ref >60)
GLUCOSE: 88 mg/dL (ref 70–99)
POTASSIUM: 2.7 mmol/L — AB (ref 3.5–5.1)
Sodium: 137 mmol/L (ref 135–145)

## 2018-01-03 MED ORDER — POTASSIUM CHLORIDE 10 MEQ/100ML IV SOLN
10.0000 meq | Freq: Once | INTRAVENOUS | Status: AC
  Administered 2018-01-03: 10 meq via INTRAVENOUS
  Filled 2018-01-03: qty 100

## 2018-01-03 MED ORDER — POTASSIUM CHLORIDE CRYS ER 20 MEQ PO TBCR: 60.0000 meq | EXTENDED_RELEASE_TABLET | Freq: Once | ORAL | Status: DC

## 2018-01-03 MED ORDER — OXYCODONE-ACETAMINOPHEN 5-325 MG PO TABS: 1.0000 | ORAL_TABLET | ORAL | 0 refills | Status: DC | PRN

## 2018-01-03 MED ORDER — OXYCODONE-ACETAMINOPHEN 5-325 MG PO TABS
2.0000 | ORAL_TABLET | Freq: Once | ORAL | Status: AC
  Administered 2018-01-03: 2 via ORAL
  Filled 2018-01-03: qty 2

## 2018-01-03 MED ORDER — PROMETHAZINE HCL 25 MG/ML IJ SOLN
12.5000 mg | Freq: Once | INTRAMUSCULAR | Status: AC
  Administered 2018-01-03: 12.5 mg via INTRAVENOUS
  Filled 2018-01-03: qty 1

## 2018-01-03 MED ORDER — DOXYCYCLINE HYCLATE 100 MG PO TABS
100.0000 mg | ORAL_TABLET | Freq: Once | ORAL | Status: AC
  Administered 2018-01-03: 100 mg via ORAL
  Filled 2018-01-03: qty 1

## 2018-01-03 MED ORDER — SODIUM CHLORIDE 0.9 % IV BOLUS
1000.0000 mL | Freq: Once | INTRAVENOUS | Status: AC
  Administered 2018-01-03: 1000 mL via INTRAVENOUS

## 2018-01-03 MED ORDER — DOXYCYCLINE HYCLATE 50 MG PO CAPS: 100.0000 mg | ORAL_CAPSULE | Freq: Two times a day (BID) | ORAL | 0 refills | Status: DC

## 2018-01-03 NOTE — ED Notes (Signed)
Pt states that she cannot tolerate oral K+ in any form and requests IV. Pt informed that she will be delayed a further 4+hours due to IV medication. MD Dolores FrameSung aware and order changed. Pt still wanting to wait for IV med vs oral.

## 2018-01-03 NOTE — Discharge Instructions (Addendum)
1.  You may take Percocet as needed for joint pain. 2.  Take antibiotic as prescribed (Doxycycline 100 mg twice daily x7 days). 3.  Return to the ER for worsening symptoms, persistent vomiting, fever or other concerns.

## 2018-01-03 NOTE — ED Provider Notes (Signed)
[facility] Emergency Department Provider Note   ____________________________________________   First MD Initiated Contact with Patient 01/03/18 [phone]     (approximate)  I have reviewed the triage vital signs and the nursing notes.   HISTORY  Chief Complaint Generalized Body Aches    HPI Krista Perkins is a 22 y.o. female G2, P1 approximately [redacted] weeks pregnant who presents to the ED from home with a chief complaint of persistent myalgias.  Patient was hospitalized at [facility] from 7/12 to 7/16 with parvovirus.  Complains of generalized arthralgias particularly in her right hip radiating to her foot.  Per [facility] records, patient was on 10 mg liquid oxycodone at one point and titrated to Tylenol at discharge.  Patient states Tylenol is not touching her pain.  Also complains of occasional nausea.  Denies fever, chills, chest pain, shortness of breath, abdominal pain, vomiting, vaginal bleeding or discharge.   Past Medical History:  Diagnosis Date  . Chest pain    a. 11/2017 CTA Chest: No PE. No coronary/aortic Ca2+.  . Morbid obesity [facility])     Patient Active Problem List   Diagnosis Date Noted  . Pyelonephritis 12/21/2017  . Pregnancy 12/12/2017    Past Surgical History:  Procedure Laterality Date  . NO PAST SURGERIES      Prior to Admission medications   Medication Sig Start Date End Date Taking? Authorizing Provider  doxycycline (VIBRAMYCIN) 50 MG capsule Take 2 capsules (100 mg total) by mouth 2 (two) times daily. 01/03/18   Irean Hong, MD  omeprazole (PRILOSEC) 40 MG capsule Take 1 capsule (40 mg total) by mouth daily. 12/22/17   Vena Austria, MD  ondansetron (ZOFRAN ODT) 4 MG disintegrating tablet Take 1 tablet (4 mg total) by mouth every 6 (six) hours as needed for nausea. 12/22/17   Vena Austria, MD  oxyCODONE-acetaminophen (PERCOCET/ROXICET) 5-325 MG tablet Take 1 tablet by mouth every 4 (four) hours as needed for severe pain. 01/03/18   Irean Hong, MD  Prenatal Vit-Fe Fumarate-FA (PRENATAL MULTIVITAMIN) TABS tablet Take 1 tablet by mouth daily at 12 noon.    [provider]  sucralfate (CARAFATE) 1 GM/10ML suspension Take 10 mLs (1 g total) by mouth 4 (four) times daily -  with meals and at bedtime. 12/22/17   Vena Austria, MD    Allergies Patient has no known allergies.  Family History  Problem Relation Age of Onset  . Diabetes Mother   . Heart failure Mother     Social History Social History   Tobacco Use  . Smoking status: Former Games developer  . Smokeless tobacco: Never Used  Substance Use Topics  . Alcohol use: Not Currently  . Drug use: Never    Review of Systems  Constitutional: No fever/chills Eyes: No visual changes. ENT: No sore throat. Cardiovascular: Denies chest pain. Respiratory: Denies shortness of breath. Gastrointestinal: No abdominal pain.  No nausea, no vomiting.  No diarrhea.  No constipation. Genitourinary: Negative for dysuria. Musculoskeletal: Positive for arthralgias.  Negative for back pain. Skin: Negative for rash. Neurological: Negative for headaches, focal weakness or numbness.   ____________________________________________   PHYSICAL EXAM:  VITAL SIGNS: ED Triage Vitals  Enc Vitals Group     BP 01/03/18 0224 116/63     Pulse Rate 01/03/18 0224 (!) 103     Resp 01/03/18 0224 20     Temp 01/03/18 0224 98.4 F (36.9 C)     Temp src --  SpO2 01/03/18 0224 96 %     Weight --      Height --      Head Circumference --      Peak Flow --      Pain Score 01/03/18 0221 10     Pain Loc --      Pain Edu? --      Excl. in GC? --     Constitutional: Alert and oriented. Well appearing and in no acute distress.  Texting on her cell phone. Eyes: Conjunctivae are normal. PERRL. EOMI. Head: Atraumatic. Nose: No congestion/rhinnorhea. Mouth/Throat: Mucous membranes are moist.  Oropharynx non-erythematous. Neck: No stridor.  No cervical spine tenderness to  palpation. Cardiovascular: Normal rate, regular rhythm. Grossly normal heart sounds.  Good peripheral circulation. Respiratory: Normal respiratory effort.  No retractions. Lungs CTAB. Gastrointestinal: Gravid.  Soft and nontender. No distention. No abdominal bruits. No CVA tenderness. Musculoskeletal: Slightly stiff bilateral hip joints which limber up with range of motion.  Right hip more tender than left.  No surrounding warmth or erythema.  No clinical suspicion for septic joint.  No joint effusions. Neurologic:  Normal speech and language. No gross focal neurologic deficits are appreciated.  Skin:  Skin is warm, dry and intact. No rash noted. Psychiatric: Mood and affect are normal. Speech and behavior are normal.  ____________________________________________   LABS (all labs ordered are listed, but only abnormal results are displayed)  Labs Reviewed  CBC WITH DIFFERENTIAL/PLATELET - Abnormal; Notable for the following components:      Result Value   WBC 18.2 (*)    RBC 3.31 (*)    Hemoglobin 9.5 (*)    HCT 27.9 (*)    RDW 15.1 (*)    nRBC 1 (*)    Neutro Abs 14.6 (*)    All other components within normal limits  BASIC METABOLIC PANEL - Abnormal; Notable for the following components:   Potassium 2.7 (*)    BUN <5 (*)    Calcium 7.9 (*)    All other components within normal limits  URINALYSIS, COMPLETE (UACMP) WITH MICROSCOPIC   ____________________________________________  EKG  None ____________________________________________  RADIOLOGY  ED MD interpretation: None  Official radiology report(s): No results found.  ____________________________________________   PROCEDURES  Procedure(s) performed: None  Procedures  Critical Care performed: No  ____________________________________________   INITIAL IMPRESSION / ASSESSMENT AND PLAN / ED COURSE  As part of my medical decision making, I reviewed the following data within the electronic MEDICAL RECORD NUMBER  History obtained from family, Nursing notes reviewed and incorporated, Labs reviewed, Old chart reviewed and Notes from prior ED visits   22 year old female G2 P1 approximately [redacted] weeks pregnant who presents with persistent arthralgias; [facility].  Differential diagnosis includes but is not limited to arthralgias secondary to viral etiology, joint effusion, septic joint, etc.  [facility]  I see a attempted telephone communication on 7/19 which went to patient's voicemail.  She had positive RMSF IgG titers.  [facility] touch base with ID department which agrees with doxycycline 100 mg twice daily x7 days.  I have queried patient whether or not she received this message.  She claims she did not.  Will initiate IV fluid resuscitation, IV morphine for pain, IV Phenergan for nausea and start doxycycline [facility].   Clinical Course as of Jan 04 711  Wynelle LinkSun Jan 03, 2018  0626 Patient states she cannot tolerate potassium tablets although  she was able to swallow 2 Percocets and a doxycycline tablet.  She was offered potassium powder mixed in orange juice or applesauce and she also refused this.  Patient insists on receiving her potassium through the IV.  She acknowledges IV may burn and will also add 4+ hours to her ED stay.   [JS]  [id number] Care transferred to Dr. Pershing Proud.  Patient remains in the ED receiving IV potassium.  Once this is completed, I anticipate discharge home on doxycycline and Percocet with close follow-up with [facility].   [JS]    Clinical Course User Index [JS] Irean Hong, MD     ____________________________________________   FINAL CLINICAL IMPRESSION(S) / ED DIAGNOSES  Final diagnoses:  Pain of right hip joint  Hypokalemia  [redacted] weeks gestation of pregnancy  [facility] spotted fever)     ED Discharge Orders        Ordered    doxycycline (VIBRAMYCIN) 50 MG capsule  2 times  daily     01/03/18 0556    oxyCODONE-acetaminophen (PERCOCET/ROXICET) 5-325 MG tablet  Every 4 hours PRN     01/03/18 0556       Note:  This document was prepared using Dragon voice recognition software and may include unintentional dictation errors.    Irean Hong, MD 01/03/18 [phone]

## 2018-01-03 NOTE — ED Notes (Signed)
MD Dolores FrameSung informed of critical pot.

## 2018-01-03 NOTE — ED Triage Notes (Signed)
Patient reports recently being [facility] and [facility].  Patient reports continued pain from hip to foot and states makes it difficulty to go to bathroom.  Patient last took Tylenol at 6 pm.

## 2018-01-03 NOTE — ED Notes (Signed)
Report given to Jessica RN

## 2018-01-04 ENCOUNTER — Ambulatory Visit (INDEPENDENT_AMBULATORY_CARE_PROVIDER_SITE_OTHER): Payer: Medicaid Other | Admitting: Obstetrics & Gynecology

## 2018-01-04 ENCOUNTER — Encounter: Payer: Self-pay | Admitting: Obstetrics & Gynecology

## 2018-01-04 VITALS — Wt 240.0 lb

## 2018-01-04 DIAGNOSIS — O98513 Other viral diseases complicating pregnancy, third trimester: Secondary | ICD-10-CM

## 2018-01-04 DIAGNOSIS — B343 Parvovirus infection, unspecified: Secondary | ICD-10-CM

## 2018-01-04 DIAGNOSIS — Z3A37 37 weeks gestation of pregnancy: Secondary | ICD-10-CM | POA: Diagnosis not present

## 2018-01-04 DIAGNOSIS — O98519 Other viral diseases complicating pregnancy, unspecified trimester: Secondary | ICD-10-CM | POA: Insufficient documentation

## 2018-01-04 DIAGNOSIS — O99213 Obesity complicating pregnancy, third trimester: Secondary | ICD-10-CM | POA: Insufficient documentation

## 2018-01-04 DIAGNOSIS — A770 Spotted fever due to Rickettsia rickettsii: Secondary | ICD-10-CM

## 2018-01-04 DIAGNOSIS — O0993 Supervision of high risk pregnancy, unspecified, third trimester: Secondary | ICD-10-CM

## 2018-01-04 MED ORDER — DOXYCYCLINE HYCLATE 50 MG PO CAPS: 100.0000 mg | ORAL_CAPSULE | Freq: Two times a day (BID) | ORAL | 0 refills | Status: DC

## 2018-01-04 NOTE — Progress Notes (Signed)
01/04/2018   Chief Complaint: Third trimester pain, infection. 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  Also has been seen by [facility]FM.  History of Present Illness: Krista Perkins is a 22 y.o. G2P1001 [redacted]w[redacted]d based on Patient's last menstrual period was 04/13/2017 (lmp unknown). with an Estimated Date of Delivery: 01/18/18, with the above CC.  1. [facility].  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  She has seen [facility] MFM for Parvo, and they wish her to have BPP weekly, with first schedule for 01/06/18 at [facility].  She was oin hospital at [facility] for 5 days related to this infection. 2. Recent RMSF just diagnosed, Doxycycline ruled as low risk at 38 weeks with better reward in treatment value so was started (although pt has not picked up ABX yet today).  No fever, rash at this time. 3. Obesity, BMI 47.  Concern for delivery complications.  Pt has been taking ASA 81 mg.  GDM neg testing. 4. Hypokalemia, noted on multiple checks at [facility] and [facility].   5. GBS Neg. 6. PAP abnormal, needs retest PP.  ROS: A 12-point review of systems was performed and negative, except as stated in the above HPI.  OBGYN History: As per HPI. OB History  Gravida Para Term Preterm AB Living  2 1 1     1   SAB TAB Ectopic Multiple Live Births               # Outcome Date GA Lbr Len/2nd Weight Sex Delivery Anes PTL Lv  2 Current           1 Term             Past Medical History: Past Medical History:  Diagnosis Date  . Chest pain    a. 11/2017 CTA Chest: No PE. No coronary/aortic Ca2+.  . 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 Morbid obesity (HCC)   . [place] spotted fever    Past Surgical History: Past Surgical History:  Procedure Laterality Date  . NO PAST SURGERIES     Family History:  Family History  Problem Relation Age of Onset  . Diabetes Mother   . Heart failure Mother    She denies any female cancers, bleeding or blood clotting disorders.  She denies  any history of mental retardation, birth defects or genetic disorders in her or the FOB's history  Social History:  Social History   Socioeconomic History  . Marital status: Single    Spouse name: Not on file  . Number of children: Not on file  . Years of education: Not on file  . Highest education level: Not on file  Occupational History  . Not on file  Social Needs  . Financial resource strain: Not on file  . Food insecurity:    Worry: Not on file    Inability: Not on file  . Transportation needs:    Medical: Not on file    Non-medical: Not on file  Tobacco Use  . Smoking status: Former Games developer  . Smokeless tobacco: Never Used  Substance and Sexual Activity  . Alcohol use: Not Currently  . Drug use: Never  . Sexual activity: Not Currently  Lifestyle  . Physical activity:    Days per week: Not on file    Minutes per session: Not on file  . Stress: Not on file  Relationships  . Social connections:    Talks  on phone: Not on file    Gets together: Not on file    Attends religious service: Not on file    Active member of club or organization: Not on file    Attends meetings of clubs or organizations: Not on file    Relationship status: Not on file  . Intimate partner violence:    Fear of current or ex partner: Not on file    Emotionally abused: Not on file    Physically abused: Not on file    Forced sexual activity: Not on file  Other Topics Concern  . Not on file  Social History Narrative   Lives in [place] with her mother.  Does not routinely exercise.   Any pets in the household: no  Allergy: No Known Allergies  Current Outpatient Medications:  Current Outpatient Medications:  .  doxycycline (VIBRAMYCIN) 50 MG capsule, Take 2 capsules (100 mg total) by mouth 2 (two) times daily., Disp: 28 capsule, Rfl: 0 .  omeprazole (PRILOSEC) 40 MG capsule, Take 1 capsule (40 mg total) by mouth daily. (Patient not taking: Reported on 01/04/2018), Disp: 30 capsule, Rfl:  11 .  ondansetron (ZOFRAN ODT) 4 MG disintegrating tablet, Take 1 tablet (4 mg total) by mouth every 6 (six) hours as needed for nausea. (Patient not taking: Reported on 01/04/2018), Disp: 30 tablet, Rfl: 0 .  oxyCODONE-acetaminophen (PERCOCET/ROXICET) 5-325 MG tablet, Take 1 tablet by mouth every 4 (four) hours as needed for severe pain. (Patient not taking: Reported on 01/04/2018), Disp: 20 tablet, Rfl: 0 .  Prenatal Vit-Fe Fumarate-FA (PRENATAL MULTIVITAMIN) TABS tablet, Take 1 tablet by mouth daily at 12 noon., Disp: , Rfl:  .  sucralfate (CARAFATE) 1 GM/10ML suspension, Take 10 mLs (1 g total) by mouth 4 (four) times daily -  with meals and at bedtime. (Patient not taking: Reported on 01/04/2018), Disp: 420 mL, Rfl: 0   Physical Exam:   Wt 240 lb (108.9 kg)   LMP 04/13/2017 (LMP Unknown)   Breastfeeding? Unknown   BMI 46.87 kg/m  Body mass index is 46.87 kg/m. Constitutional: Well nourished, well developed female in no acute distress.  Neck:  Supple, normal appearance, and no thyromegaly  Cardiovascular: S1, S2 normal, no murmur, rub or gallop, regular rate and rhythm Respiratory:  Clear to auscultation bilateral. Normal respiratory effort Abdomen: positive bowel sounds and no masses, hernias; diffusely non tender to palpation, non distended Neuro/Psych:  Normal mood and affect.  Skin:  Warm and dry.  Lymphatic:  No inguinal lymphadenopathy.  FHT 140s  Pelvic exam: is limited by body habitus EGBUS: within normal limits, Vagina: within normal limits and with no blood in the vault, Cervix: normal appearing cervix without discharge or lesions, FT/long/high, Uterus:  enlarged, and Adnexa:  not evaluated  Assessment: Krista Perkins is a 22 y.o. G2P1001 [redacted]w[redacted]d based on Patient's last menstrual period was 04/13/2017 (lmp unknown). with an Estimated Date of Delivery: 01/18/18,  for High Risk Transfer of prenatal care.  Plan:  1. Pt desires to deliver in [facility].  Despte having [facility] on  board, she does not wish to deliver at [facility] unless medically necessary.  BMI discussed. 2. Obesity as a risk factor.  Appropriate testing by [facility].  Cont ASA.  IOL not indicated for obesity, until 41 weeks. 3. Parvo infection.  Follow [facility] MFM guidelines.  For now, BPP weekly there.  IOL based on their rec's. 4. RMSF.  Start Docycycline today.  Pros and cons discussed. 5. Hypokalemia.  Cont  po therpy. 6. GBS Neg. 7. PP Contraception- desires Nexplanon 8. PAP PP  Problem list reviewed and updated.  Annamarie MajorPaul Natajah Derderian, MD, Merlinda FrederickFACOG [facility], [facility] 01/04/2018  7:06 PM

## 2018-01-04 NOTE — Patient Instructions (Signed)
[facility] Wed July 24 at 11:00

## 2018-01-06 ENCOUNTER — Telehealth: Payer: Self-pay

## 2018-01-06 ENCOUNTER — Ambulatory Visit: Payer: Medicaid Other | Admitting: Gastroenterology

## 2018-01-06 NOTE — Telephone Encounter (Signed)
Krista Perkins (pt mom) reports pt was seen Monday & given antibiotics & something for pain. She is having trouble keeping any of it down. She can't eat. She's running fever off & on. She can't keep water down. She's hurting in her lower back. She's lost her mucus plug. Inquiring if she needs to go to hospital. She hasn't slept in 3 nights. Reports losing fluid/wiping fluid q 5 minutes. DG#[id number]#[phone]

## 2018-01-06 NOTE — Telephone Encounter (Signed)
Spoke w/Tracy (pt mom) notified no DPR on file. No patient info released. Given urgency of her condition did advise per protocol if pt has been unable to keep anything down for at least 24 hours she should report to hospital for eval. Aware to check in at ER.

## 2018-01-07 ENCOUNTER — Observation Stay
Admission: EM | Admit: 2018-01-07 | Discharge: 2018-01-07 | Disposition: A | Discharge status: Home / Self Care | Payer: Medicaid Other | Attending: Obstetrics and Gynecology | Admitting: Obstetrics and Gynecology

## 2018-01-07 ENCOUNTER — Other Ambulatory Visit: Payer: Self-pay

## 2018-01-07 DIAGNOSIS — Z3A38 38 weeks gestation of pregnancy: Secondary | ICD-10-CM | POA: Diagnosis not present

## 2018-01-07 DIAGNOSIS — O368190 Decreased fetal movements, unspecified trimester, not applicable or unspecified: Secondary | ICD-10-CM | POA: Diagnosis present

## 2018-01-07 DIAGNOSIS — O368130 Decreased fetal movements, third trimester, not applicable or unspecified: Principal | ICD-10-CM | POA: Insufficient documentation

## 2018-01-07 NOTE — Final Progress Note (Signed)
Physician Final Progress Note  Patient ID: Krista Perkins MRN: [id number] DOB/AGE: [birth year] 22 y.o.  Admit date: 01/07/2018 Admitting provider: Vena Austria, MD Discharge date: 01/07/2018   Admission Diagnoses: Decreased fetal movement  Discharge Diagnoses:  Active Problems:   Decreased fetal movement  22 y.o. G2P1001 at [redacted]w[redacted]d presenting with decreased fetal movement and possible loss of fluid.  No contractions, no vaginal bleeding.  She continues to have the same arthralgias, myalgias as during her initial work up at [facility] with negative work up including MRI.  She was presumed to have acute parvovirus based on positive IgG and IgM antibodies but review of records shows confirmatory PCR did not detect any parvovirus B19.  Monospot test and EBV were also IgG and IgM positive but EBV PCR was also negative.  The patient does not relay any known tick bites but RMSF IgG was positive, no PCR on record and recommendation by [facility] ID and MFM to start doxycyline.  Tracing here reactive, no contractions.  No pooling or ferning noted.   Blood pressure 124/64, pulse 100, temperature 98.1 F (36.7 C), temperature source Oral, resp. rate 20, height 5' (1.524 m), weight 240 lb (108.9 kg), last menstrual period 04/13/2017, SpO2 99 %, unknown if currently breastfeeding.    Consults: None  Significant Findings/ Diagnostic Studies: none  Procedures:  Baseline: 135 Variability: moderate Accelerations: present Decelerations: absent Tocometry: none The patient was monitored for 30 minutes, fetal heart rate tracing was deemed reactive, category I tracing,  Discharge Condition: good  Disposition: Discharge disposition: 01-Home or Self Care       Diet: Regular diet  Discharge Activity: Activity as tolerated  Discharge Instructions    Discharge activity:  No Restrictions   Complete by:  As directed    Discharge diet:  No restrictions   Complete by:  As directed    Fetal Kick Count:  Lie on  our left side for one hour after a meal, and count the number of times your baby kicks.  If it is less than 5 times, get up, move around and drink some juice.  Repeat the test 30 minutes later.  If it is still less than 5 kicks in an hour, notify your doctor.   Complete by:  As directed    LABOR:  When conractions begin, you should start to time them from the beginning of one contraction to the beginning  of the next.  When contractions are 5 - 10 minutes apart or less and have been regular for at least an hour, you should call your health care provider.   Complete by:  As directed    No sexual activity restrictions   Complete by:  As directed    Notify physician for bleeding from the vagina   Complete by:  As directed    Notify physician for blurring of vision or spots before the eyes   Complete by:  As directed    Notify physician for chills or fever   Complete by:  As directed    Notify physician for fainting spells, "black outs" or loss of consciousness   Complete by:  As directed    Notify physician for increase in vaginal discharge   Complete by:  As directed    Notify physician for leaking of fluid   Complete by:  As directed    Notify physician for pain or burning when urinating   Complete by:  As directed    Notify physician for pelvic pressure (sudden increase)  Complete by:  As directed    Notify physician for severe or continued nausea or vomiting   Complete by:  As directed    Notify physician for sudden gushing of fluid from the vagina (with or without continued leaking)   Complete by:  As directed    Notify physician for sudden, constant, or occasional abdominal pain   Complete by:  As directed    Notify physician if baby moving less than usual   Complete by:  As directed      Allergies as of 01/07/2018   No Known Allergies     Medication List    TAKE these medications   doxycycline 50 MG capsule Commonly known as:  VIBRAMYCIN Take 2 capsules (100 mg total) by  mouth 2 (two) times daily.   omeprazole 40 MG capsule Commonly known as:  PRILOSEC Take 1 capsule (40 mg total) by mouth daily.   ondansetron 4 MG disintegrating tablet Commonly known as:  ZOFRAN ODT Take 1 tablet (4 mg total) by mouth every 6 (six) hours as needed for nausea.   oxyCODONE-acetaminophen 5-325 MG tablet Commonly known as:  PERCOCET/ROXICET Take 1 tablet by mouth every 4 (four) hours as needed for severe pain.   prenatal multivitamin Tabs tablet Take 1 tablet by mouth daily at 12 noon.   sucralfate 1 GM/10ML suspension Commonly known as:  CARAFATE Take 10 mLs (1 g total) by mouth 4 (four) times daily -  with meals and at bedtime.        Total time spent taking care of this patient: 50 minutes, including review of prior records from outside institution  Signed: Vena Austriandreas Jshaun Abernathy 01/07/2018, 6:34 PM

## 2018-01-07 NOTE — Discharge Summary (Signed)
See final progress note. 

## 2018-01-07 NOTE — OB Triage Note (Addendum)
Pt presents stating she "has 3 viruses, rocky [place] spotted tick fever, parvo, and mono". Pt states she hasn't been able to keep anything down for the past few days. C/o fever off and on. Reports LOF, nitrazine equivcoal. Pt hasn't felt baby move all day. Pt states she has thrown up about 6 times today. Denies any bleeding. Pt states "I can't use my right arm, and it is hard for me to stand". Pt also states "I need help to the bathroom and taking a bath". Pt reports this weakness has been going on for a bout a month.vitals WNL. Will continue to monitor.

## 2018-01-12 ENCOUNTER — Encounter: Payer: Medicaid Other | Admitting: Certified Nurse Midwife

## 2018-01-15 DIAGNOSIS — O1490 Unspecified pre-eclampsia, unspecified trimester: Secondary | ICD-10-CM

## 2018-01-17 HISTORY — PX: IR EMBO ART  VEN HEMORR LYMPH EXTRAV  INC GUIDE ROADMAPPING: IMG5450

## 2018-01-17 HISTORY — PX: DILATION AND CURETTAGE OF UTERUS: SHX78

## 2018-01-28 ENCOUNTER — Emergency Department: Payer: Medicaid Other

## 2018-01-28 ENCOUNTER — Encounter: Payer: Self-pay | Admitting: Intensive Care

## 2018-01-28 ENCOUNTER — Emergency Department
Admission: EM | Admit: 2018-01-28 | Discharge: 2018-01-28 | Disposition: A | Discharge status: Other Acute Inpatient Hospital | Payer: Medicaid Other | Attending: Emergency Medicine | Admitting: Emergency Medicine

## 2018-01-28 ENCOUNTER — Other Ambulatory Visit: Payer: Self-pay

## 2018-01-28 DIAGNOSIS — O159 Eclampsia, unspecified as to time period: Secondary | ICD-10-CM

## 2018-01-28 DIAGNOSIS — R569 Unspecified convulsions: Secondary | ICD-10-CM | POA: Diagnosis present

## 2018-01-28 DIAGNOSIS — Z87891 Personal history of nicotine dependence: Secondary | ICD-10-CM | POA: Diagnosis not present

## 2018-01-28 HISTORY — DX: Unspecified convulsions: R56.9

## 2018-01-28 LAB — COMPREHENSIVE METABOLIC PANEL
ALBUMIN: 2.5 g/dL — AB (ref 3.5–5.0)
ALT: 11 U/L (ref 0–44)
AST: 28 U/L (ref 15–41)
Alkaline Phosphatase: 152 U/L — ABNORMAL HIGH (ref 38–126)
Anion gap: 13 (ref 5–15)
BILIRUBIN TOTAL: 0.8 mg/dL (ref 0.3–1.2)
BUN: 5 mg/dL — AB (ref 6–20)
CHLORIDE: 102 mmol/L (ref 98–111)
CO2: 20 mmol/L — AB (ref 22–32)
Calcium: 8.4 mg/dL — ABNORMAL LOW (ref 8.9–10.3)
Creatinine, Ser: 0.51 mg/dL (ref 0.44–1.00)
GFR calc Af Amer: >60 mL/min (ref >60)
GFR calc non Af Amer: >60 mL/min (ref >60)
GLUCOSE: 111 mg/dL — AB (ref 70–99)
Potassium: 3.2 mmol/L — ABNORMAL LOW (ref 3.5–5.1)
Sodium: 135 mmol/L (ref 135–145)
TOTAL PROTEIN: 7.9 g/dL (ref 6.5–8.1)

## 2018-01-28 LAB — URINALYSIS, COMPLETE (UACMP) WITH MICROSCOPIC
BACTERIA UA: NONE SEEN
Bilirubin Urine: NEGATIVE
Glucose, UA: 50 mg/dL — AB
KETONES UR: 20 mg/dL — AB
Leukocytes, UA: NEGATIVE
Nitrite: NEGATIVE
PROTEIN: 100 mg/dL — AB
Specific Gravity, Urine: 1.025 (ref 1.005–1.030)
pH: 6 (ref 5.0–8.0)

## 2018-01-28 LAB — CBC WITH DIFFERENTIAL/PLATELET
BASOS ABS: 0 10*3/uL (ref 0–0.1)
Basophils Relative: 0 %
Eosinophils Absolute: 0 10*3/uL (ref 0–0.7)
Eosinophils Relative: 0 %
HEMATOCRIT: 28.3 % — AB (ref 35.0–47.0)
Hemoglobin: 9.6 g/dL — ABNORMAL LOW (ref 12.0–16.0)
LYMPHS ABS: 1.3 10*3/uL (ref 1.0–3.6)
LYMPHS PCT: 5 %
MCH: 28.7 pg (ref 26.0–34.0)
MCHC: 33.9 g/dL (ref 32.0–36.0)
MCV: 84.8 fL (ref 80.0–100.0)
MONOS PCT: 7 %
Monocytes Absolute: 1.8 10*3/uL — ABNORMAL HIGH (ref 0.2–0.9)
Neutro Abs: 23.2 10*3/uL — ABNORMAL HIGH (ref 1.4–6.5)
Neutrophils Relative %: 88 %
Platelets: 396 10*3/uL (ref 150–440)
RBC: 3.34 MIL/uL — AB (ref 3.80–5.20)
RDW: 16.2 % — AB (ref 11.5–14.5)
WBC: 26.3 10*3/uL — AB (ref 3.6–11.0)

## 2018-01-28 MED ORDER — DIPHENHYDRAMINE HCL 50 MG/ML IJ SOLN
25.0000 mg | Freq: Once | INTRAMUSCULAR | Status: AC
Start: 2018-01-28 — End: 2018-01-28
  Administered 2018-01-28: 25 mg via INTRAVENOUS

## 2018-01-28 MED ORDER — MIDAZOLAM HCL 5 MG/5ML IJ SOLN
1.0000 mg | Freq: Once | INTRAMUSCULAR | Status: AC
  Administered 2018-01-28: 1 mg via INTRAVENOUS

## 2018-01-28 MED ORDER — MIDAZOLAM HCL 5 MG/5ML IJ SOLN
2.0000 mg | Freq: Once | INTRAMUSCULAR | Status: AC
  Administered 2018-01-28: 2 mg via INTRAVENOUS

## 2018-01-28 MED ORDER — DIPHENHYDRAMINE HCL 50 MG/ML IJ SOLN
INTRAMUSCULAR | Status: AC
  Filled 2018-01-28: qty 1

## 2018-01-28 MED ORDER — MAGNESIUM SULFATE 4 GM/100ML IV SOLN
4.0000 g | Freq: Once | INTRAVENOUS | Status: DC
  Administered 2018-01-28: 4 g via INTRAVENOUS

## 2018-01-28 MED ORDER — LORAZEPAM 2 MG/ML IJ SOLN
1.0000 mg | Freq: Once | INTRAMUSCULAR | Status: AC
  Administered 2018-01-28: 1 mg via INTRAVENOUS

## 2018-01-28 MED ORDER — MIDAZOLAM HCL 5 MG/5ML IJ SOLN
0.5000 mg | Freq: Once | INTRAMUSCULAR | Status: AC
  Administered 2018-01-28: 0.5 mg via INTRAVENOUS

## 2018-01-28 MED ORDER — LORAZEPAM 2 MG/ML IJ SOLN
INTRAMUSCULAR | Status: AC
  Administered 2018-01-28: 1 mg via INTRAVENOUS
  Filled 2018-01-28: qty 1

## 2018-01-28 MED ORDER — CEFAZOLIN SODIUM-DEXTROSE 1-4 GM/50ML-% IV SOLN
1.0000 g | Freq: Once | INTRAVENOUS | Status: AC
  Administered 2018-01-28: 1 g via INTRAVENOUS
  Filled 2018-01-28: qty 50

## 2018-01-28 MED ORDER — FENTANYL CITRATE (PF) 100 MCG/2ML IJ SOLN
25.0000 ug | Freq: Once | INTRAMUSCULAR | Status: AC
  Administered 2018-01-28: 25 ug via INTRAVENOUS

## 2018-01-28 MED ORDER — MAGNESIUM SULFATE 2 GM/50ML IV SOLN
2.0000 g | Freq: Once | INTRAVENOUS | Status: DC
  Administered 2018-01-28: 2 g via INTRAVENOUS

## 2018-01-28 MED ORDER — LABETALOL HCL 5 MG/ML IV SOLN
10.0000 mg | Freq: Once | INTRAVENOUS | Status: AC
  Administered 2018-01-28: 10 mg via INTRAVENOUS
  Filled 2018-01-28: qty 4

## 2018-01-28 MED ORDER — MAGNESIUM SULFATE 2 GM/50ML IV SOLN: 2.0000 g | Freq: Once | INTRAVENOUS | Status: DC

## 2018-01-28 MED ORDER — FENTANYL CITRATE (PF) 100 MCG/2ML IJ SOLN
50.0000 ug | Freq: Once | INTRAMUSCULAR | Status: AC
  Administered 2018-01-28: 50 ug via INTRAVENOUS

## 2018-01-28 MED ORDER — MAGNESIUM SULFATE 40 G IN LACTATED RINGERS - SIMPLE
2.0000 g/h | INTRAVENOUS | Status: DC
  Administered 2018-01-28: 2 g/h via INTRAVENOUS
  Filled 2018-01-28: qty 500

## 2018-01-28 MED FILL — Lactated Ringer's Solution: INTRAVENOUS | Qty: 500 | Status: AC

## 2018-01-28 MED FILL — Magnesium Sulfate Inj 50%: INTRAMUSCULAR | Qty: 80 | Status: AC

## 2018-01-28 NOTE — ED Notes (Signed)
Pt arrived back to room from CT.

## 2018-01-28 NOTE — ED Notes (Addendum)
Patient seizing at this time. MD at bedside. O2 placed on patient

## 2018-01-28 NOTE — ED Notes (Signed)
Verbal report given to Morton StallMira McPeek, RN with [facility].

## 2018-01-28 NOTE — ED Notes (Signed)
Pt taken to CT by Devan RN and returned under same care.

## 2018-01-28 NOTE — ED Notes (Signed)
Family at bedside states baby was delivered on August 2nd and had d/c Friday due to hemorrhaging.

## 2018-01-28 NOTE — ED Provider Notes (Signed)
[facility] Emergency Department Provider Note   ____________________________________________   First MD Initiated Contact with Patient 01/28/18 1408     (approximate)  I have reviewed the triage vital signs and the nursing notes.   HISTORY  Chief Complaint Seizures    HPI Crisoforo Oxfordorrie K Molner is a 22 y.o. female patient [facility] and then had problems with postpartum hemorrhage she had a D&C and uterine artery embolization.  She also had a UTI possible [facility] spotted fever and left-sided weakness prior just prior to the delivery.  She went home in good health and since then was doing well but today had a seizure tonic-clonic generalized witnessed by mom and another one in the emergency room.  Patient was postictal after each seizure and in the emergency room week required sedation she would not hold still and try to keep pulling out her IVs.  On arrival here she received 6 g of IV magnesium was put on a drip of 2/h.  She then got Ativan 1 mg at a time to a total of 4 mg and then Versed 1 to 2 mg 3 times a total of 10 mg fentanyl 25 IV once Benadryl 25 IV once and then fentanyl another 50  micrograms IV this got the patient to rest and relax so we could initiate evaluating her with chest x-ray etc. she woke up rapidly and is now much better.  Patellar reflexes have been brisk   Past Medical History:  Diagnosis Date  . Chest pain    a. 11/2017 CTA Chest: No PE. No coronary/aortic Ca2+.  . 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 Morbid obesity (HCC)   . [facility] fever   . Seizures [facility](HCC)     Patient Active Problem List   Diagnosis Date Noted  . Decreased fetal movement 01/07/2018  . [facility]([place] spotted fever) 01/04/2018  . Pregnancy complicated by obesity, third trimester 01/04/2018  . Parvovirus B19 infection complicating pregnancy 01/04/2018  . Pyelonephritis 12/21/2017  . High-risk pregnancy in third trimester 12/12/2017    Past  Surgical History:  Procedure Laterality Date  . NO PAST SURGERIES      Prior to Admission medications   Medication Sig Start Date End Date Taking? Authorizing Provider  doxycycline (VIBRAMYCIN) 50 MG capsule Take 2 capsules (100 mg total) by mouth 2 (two) times daily. 01/04/18   Nadara MustardHarris, Robert P, MD  omeprazole (PRILOSEC) 40 MG capsule Take 1 capsule (40 mg total) by mouth daily. Patient not taking: Reported on 01/04/2018 12/22/17   Vena AustriaStaebler, Andreas, MD  ondansetron (ZOFRAN ODT) 4 MG disintegrating tablet Take 1 tablet (4 mg total) by mouth every 6 (six) hours as needed for nausea. Patient not taking: Reported on 01/04/2018 12/22/17   Vena AustriaStaebler, Andreas, MD  oxyCODONE-acetaminophen (PERCOCET/ROXICET) 5-325 MG tablet Take 1 tablet by mouth every 4 (four) hours as needed for severe pain. 01/03/18   Irean HongSung, Jade J, MD  Prenatal Vit-Fe Fumarate-FA (PRENATAL MULTIVITAMIN) TABS tablet Take 1 tablet by mouth daily at 12 noon.    [provider]  sucralfate (CARAFATE) 1 GM/10ML suspension Take 10 mLs (1 g total) by mouth 4 (four) times daily -  with meals and at bedtime. Patient not taking: Reported on 01/04/2018 12/22/17   Vena AustriaStaebler, Andreas, MD    Allergies Patient has no known allergies.  Family History  Problem Relation Age of Onset  . Diabetes Mother   . Heart failure Mother  Social History Social History   Tobacco Use  . Smoking status: Former Games developermoker  . Smokeless tobacco: Never Used  Substance Use Topics  . Alcohol use: Not Currently  . Drug use: Never    Review of Systems Unobtainable due to postictal nests and sedation ____________________________________________   PHYSICAL EXAM:  VITAL SIGNS: ED Triage Vitals  Enc Vitals Group     BP 01/28/18 1312 (!) 160/126     Pulse Rate 01/28/18 1311 (!) 158     Resp 01/28/18 1311 14     Temp --      Temp src --      SpO2 01/28/18 1311 100 %     Weight 01/28/18 1312 240 lb (108.9 kg)     Height 01/28/18 1312 5' (1.524 m)      Head Circumference --      Peak Flow --      Pain Score --      Pain Loc --      Pain Edu? --      Excl. in GC? --     Constitutional: Confused and combative later groggy but cooperative Eyes: Conjunctivae are normal. PERRL. EOMI. Head: Atraumatic. Nose: No congestion/rhinnorhea. Mouth/Throat: Mucous membranes are moist.  Oropharynx non-erythematous. Neck: No stridor. Cardiovascular: Normal rate, regular rhythm. Grossly normal heart sounds.  Good peripheral circulation. Respiratory: Normal respiratory effort.  No retractions. Lungs CTAB. Gastrointestinal: Soft and nontender. No distention. No abdominal bruits. No CVA tenderness. Musculoskeletal: No lower extremity tenderness nor edema. Neurologic: Being all extremities equally and very well will not follow commands patellar reflexes are brisk Skin:  Skin is warm, dry and intact. No rash noted.  ____________________________________________   LABS (all labs ordered are listed, but only abnormal results are displayed)  Labs Reviewed  CBC WITH DIFFERENTIAL/PLATELET - Abnormal; Notable for the following components:      Result Value   WBC 26.3 (*)    RBC 3.34 (*)    Hemoglobin 9.6 (*)    HCT 28.3 (*)    RDW 16.2 (*)    Neutro Abs 23.2 (*)    Monocytes Absolute 1.8 (*)    All other components within normal limits  COMPREHENSIVE METABOLIC PANEL - Abnormal; Notable for the following components:   Potassium 3.2 (*)    CO2 20 (*)    Glucose, Bld 111 (*)    BUN 5 (*)    Calcium 8.4 (*)    Albumin 2.5 (*)    Alkaline Phosphatase 152 (*)    All other components within normal limits  URINALYSIS, COMPLETE (UACMP) WITH MICROSCOPIC - Abnormal; Notable for the following components:   Color, Urine YELLOW (*)    APPearance CLOUDY (*)    Glucose, UA 50 (*)    Hgb urine dipstick MODERATE (*)    Ketones, ur 20 (*)    Protein, ur 100 (*)    All other components within normal limits    ____________________________________________  EKG  EKG shows sinus tachycardia rate 169 normal axis possible right atrial enlargement difficult to tell no obvious acute changes ____________________________________________  RADIOLOGY  ED MD interpretation: Chest x-ray reviewed by me read by radiology shows no acute changes  Official radiology report(s): Ct Head Wo Contrast  Addendum Date: 01/28/2018   ADDENDUM REPORT: 01/28/2018 15:38 ADDENDUM: The original report was by Dr. Gaylyn RongWalter Liebkemann. The following addendum is by Dr. Gaylyn RongWalter Liebkemann: These results were called by telephone at the time of interpretation on 01/28/2018 at 3:37 pm to Dr. Renae FicklePAUL  MALINDA , who verbally acknowledged these results. Electronically Signed   By: Gaylyn Rong M.D.   On: 01/28/2018 15:38   Result Date: 01/28/2018 CLINICAL DATA:  Witnessed seizure lasting about 10 minutes at 12:30 p.m. today. Abdominal pain. Recent postpartum hemorrhage with D&C on Friday. EXAM: CT HEAD WITHOUT CONTRAST TECHNIQUE: Contiguous axial images were obtained from the base of the skull through the vertex without intravenous contrast. COMPARISON:  None. FINDINGS: Brain: Accentuated hypodensity in the posterior vertex white matter along with faint hypodensity posteriorly in the left occipitoparietal region. Subtle focal hypodensity bilaterally along the upper margin of the corpus callosum in the parietal lobe. The brainstem, cerebellum, cerebral peduncles, thalami, basal ganglia, and reticular system appear unremarkable. No intracranial hemorrhage is observed. Vascular: Unremarkable Skull: Unremarkable Sinuses/Orbits: Unremarkable Other: No supplemental non-categorized findings. IMPRESSION: 1. Hypodensities in the white matter near the parietal vertex and potentially in the medial corona radiata just above the corpus callosum, and in the left occipitoparietal region peripherally. The findings are subtle but raise concern for the possibility  of posterior reversible encephalopathy. Correlate with blood pressure measurements. MRI brain with and without contrast is recommended if feasible for more specific characterization. Electronically Signed: By: Gaylyn Rong M.D. On: 01/28/2018 15:31   Dg Chest Portable 1 View  Result Date: 01/28/2018 CLINICAL DATA:  Seizure. EXAM: PORTABLE CHEST 1 VIEW COMPARISON:  Radiographs of November 29, 2017. FINDINGS: The heart size and mediastinal contours are within normal limits. Hypoinflation of the lungs is noted. No pneumothorax or pleural effusion is noted. No consolidative process is noted. The visualized skeletal structures are unremarkable. IMPRESSION: Hypoinflation of the lungs. No acute cardiopulmonary abnormality seen. Electronically Signed   By: Lupita Raider, M.D.   On: 01/28/2018 14:22    ____________________________________________   PROCEDURES  Procedure(s) performed:   Procedures  Critical Care performed: Critical care time 1-1/2 hours I was in the room the whole time for the sedation and pushed most of the Versed and fentanyl myself.  I also checked patellar reflexes discussed the patient twice with her family.  We were able to provide a lot of the history. Patient's white count is elevated this is probably due to the seizure but she also has a UTI that is been untreated patient could not tolerate the pills.  She has no known allergies I will give her some Rocephin IV to begin the treatment for the UTI. ____________________________________________   INITIAL IMPRESSION / ASSESSMENT AND PLAN / ED COURSE  ----------------------------------------- 3:16 PM on 01/28/2018 -----------------------------------------  Patellar reflexes are still very brisk   ----------------------------------------- 4:06 PM on 01/28/2018 -----------------------------------------  She is sleeping when I am in the room but mom reports she is talking to her and is awake alert and making sense.  CT  reported as possible press.  I called [facility] and spoke to Dr. Jorje Guild on OB/GYN.  She excepted the patient in transfer she is aware of the CT findings and the need for an MRI.  They were working to arrange transport.  If the patient is required to be here long enough to do the MRI we will do it here otherwise we will do it at [facility].      ____________________________________________   FINAL CLINICAL IMPRESSION(S) / ED DIAGNOSES  Final diagnoses:  Eclampsia     ED Discharge Orders    None       Note:  This document was prepared using Dragon voice recognition software and may include unintentional dictation errors.  Arnaldo Natal, MD 01/28/18 [phone]

## 2018-01-28 NOTE — ED Notes (Signed)
EMTALA reviewed by this RN.  

## 2018-01-28 NOTE — ED Notes (Signed)
Pt taken to CT with this nurse at bedside.

## 2018-01-28 NOTE — ED Notes (Addendum)
Consent for transfer signed by father, Lajoyce Lauberorrance Binz due to patient being drowsy.

## 2018-01-28 NOTE — ED Notes (Signed)
Patient post-ictal this time, trying to rip oxygen off, moving all around in bed, grunting. Disoriented X4

## 2018-01-28 NOTE — ED Triage Notes (Signed)
Patient arrived by EMS from home for witnessed seizure by mom. Mom reports seizure lasted 5-10 minutes at 12:30pm. Patient gave birth X1 month ago, hemorrhaged X1 month and had D/C  On Friday. Pt c/o abdominal pain. Patient injured tongue and mouth during seizure at home. Patient is lethargic upon arrival and mostly gives bank stares when asked questions

## 2018-01-29 DIAGNOSIS — O99212 Obesity complicating pregnancy, second trimester: Secondary | ICD-10-CM | POA: Insufficient documentation

## 2018-01-29 DIAGNOSIS — R569 Unspecified convulsions: Secondary | ICD-10-CM | POA: Insufficient documentation

## 2018-02-01 DIAGNOSIS — I6783 Posterior reversible encephalopathy syndrome: Secondary | ICD-10-CM | POA: Insufficient documentation

## 2018-03-18 DIAGNOSIS — R7689 Other specified abnormal immunological findings in serum: Secondary | ICD-10-CM | POA: Insufficient documentation

## 2018-03-20 DIAGNOSIS — G6281 Critical illness polyneuropathy: Secondary | ICD-10-CM | POA: Insufficient documentation

## 2018-04-29 DIAGNOSIS — F4323 Adjustment disorder with mixed anxiety and depressed mood: Secondary | ICD-10-CM | POA: Insufficient documentation

## 2019-03-23 DIAGNOSIS — Z7189 Other specified counseling: Secondary | ICD-10-CM | POA: Insufficient documentation

## 2019-03-23 DIAGNOSIS — Z0000 Encounter for general adult medical examination without abnormal findings: Secondary | ICD-10-CM | POA: Insufficient documentation

## 2019-03-23 DIAGNOSIS — G6289 Other specified polyneuropathies: Secondary | ICD-10-CM | POA: Insufficient documentation

## 2019-03-23 DIAGNOSIS — Z3490 Encounter for supervision of normal pregnancy, unspecified, unspecified trimester: Secondary | ICD-10-CM | POA: Insufficient documentation

## 2019-04-22 DIAGNOSIS — O0993 Supervision of high risk pregnancy, unspecified, third trimester: Secondary | ICD-10-CM | POA: Insufficient documentation

## 2019-04-28 DIAGNOSIS — O09299 Supervision of pregnancy with other poor reproductive or obstetric history, unspecified trimester: Secondary | ICD-10-CM | POA: Insufficient documentation

## 2019-05-10 DIAGNOSIS — Z315 Encounter for genetic counseling: Secondary | ICD-10-CM | POA: Insufficient documentation

## 2019-05-17 DIAGNOSIS — O2340 Unspecified infection of urinary tract in pregnancy, unspecified trimester: Secondary | ICD-10-CM | POA: Insufficient documentation

## 2019-06-16 IMAGING — CT CT HEAD W/O CM
4 series · 15 of 47 positions shown, 17 images · non-contrast
Comparison: None.

ADDENDUM:
The original report was by Dr. Hanen Ep Sadika. The following
addendum is by Dr. Hanen Ep Sadika:

These results were called by telephone at the time of interpretation
on 01/28/2018 at [DATE] to Dr. ELIA CAB , who verbally
acknowledged these results.
CLINICAL DATA: Witnessed seizure lasting about 10 minutes at [DATE]
p.m. today. Abdominal pain. Recent postpartum hemorrhage with D&C
on [REDACTED].
EXAM:
CT HEAD WITHOUT CONTRAST
TECHNIQUE: Contiguous axial images were obtained from the base of the skull
through the vertex without intravenous contrast.

[Series 2: head wo · axial · 0.40mm/px · z∈[-106,-6]mm · 7 of 28 slices shown, 9 images]
[im 4/28  brain]
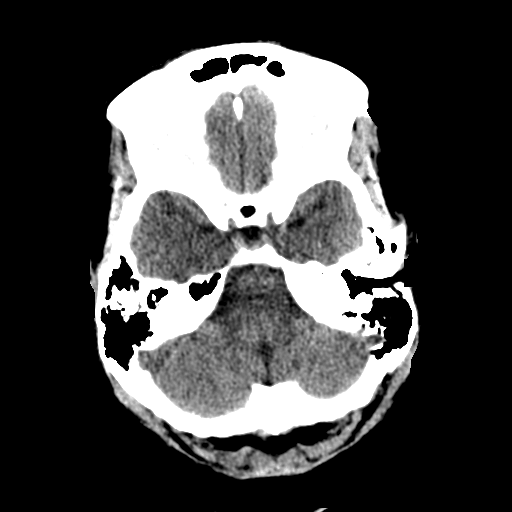
[im 4/28  bone]
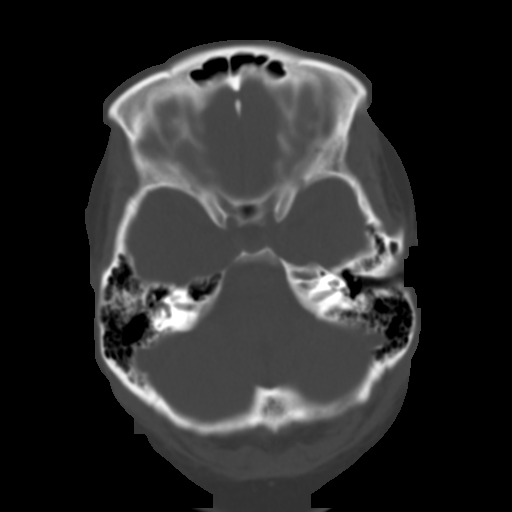
[im 7/28  brain]
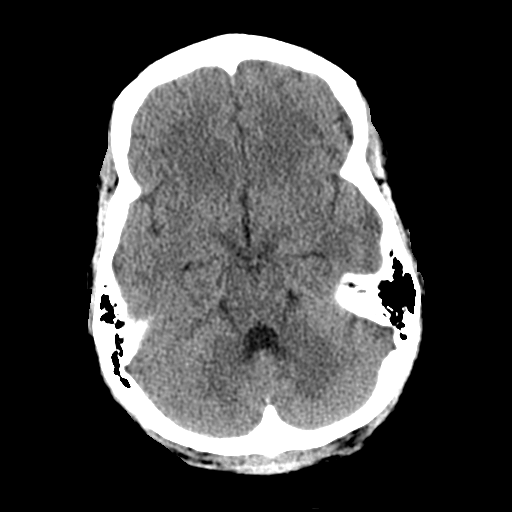
[im 11/28  brain]
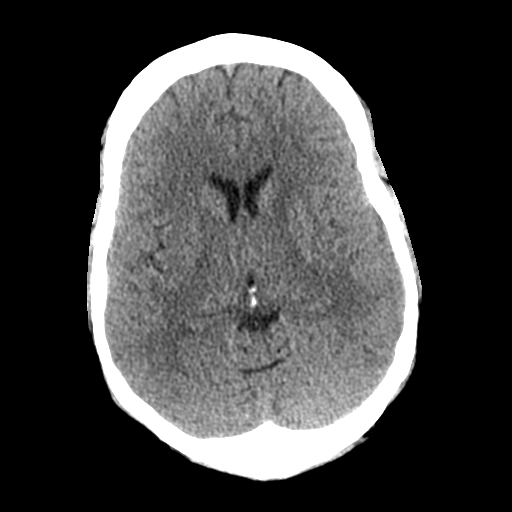
[im 14/28  brain]
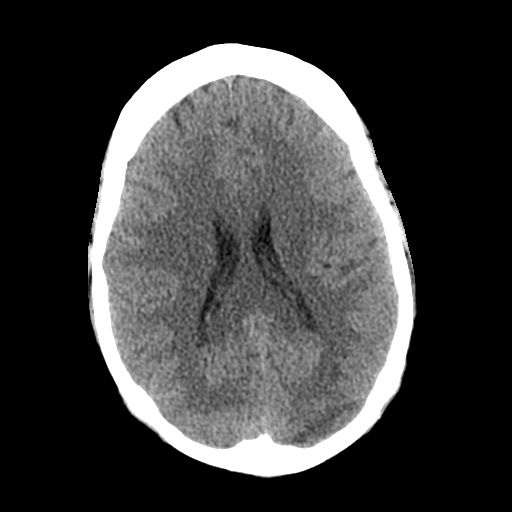
[im 17/28  brain]
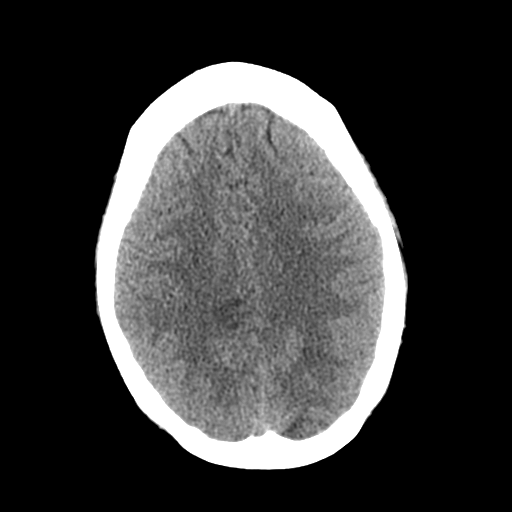
[im 17/28  bone]
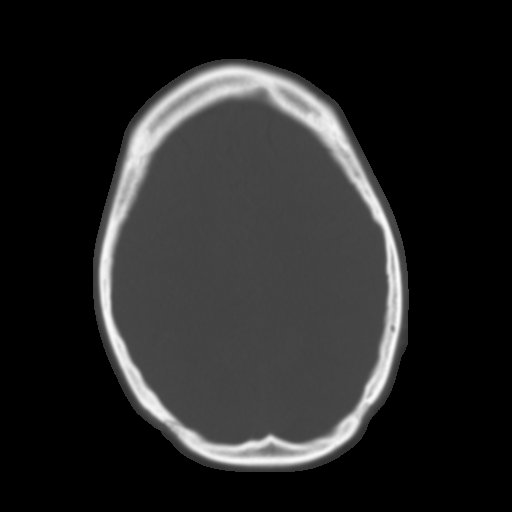
[im 21/28  brain]
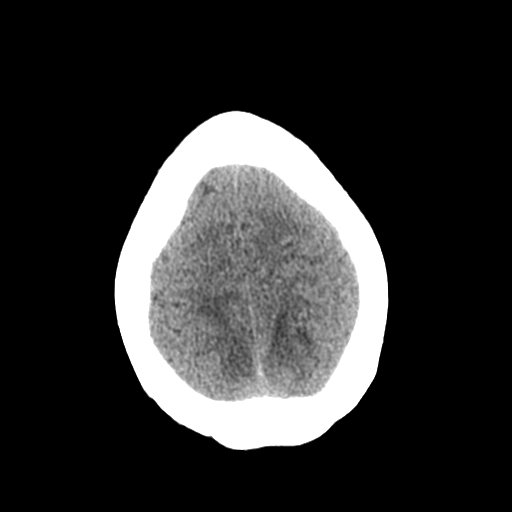
[im 24/28  brain]
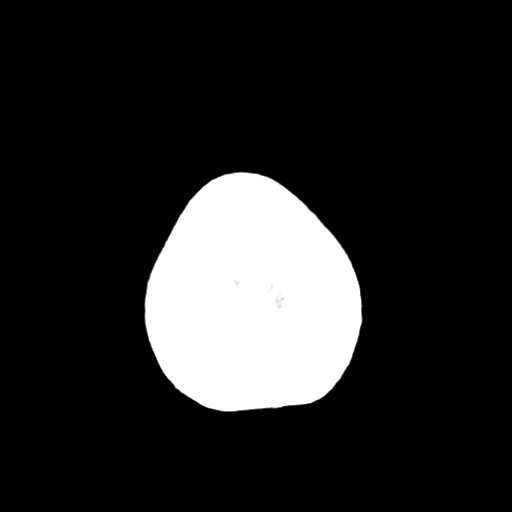

[Series 3: head bone · axial · 0.40mm/px · z∈[-109,-95]mm · 2 of 70 slices shown]
[im 7/70  bone]
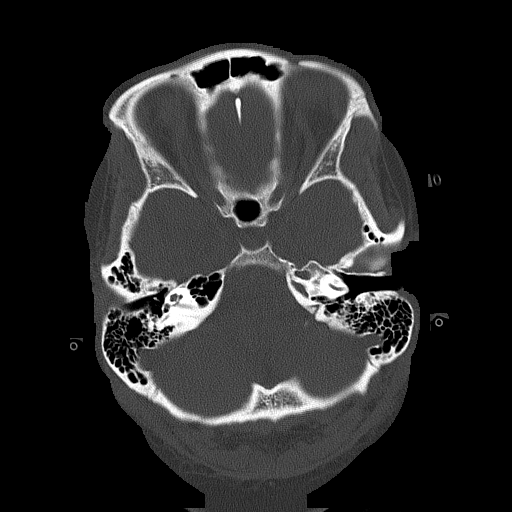
[im 14/70  bone]
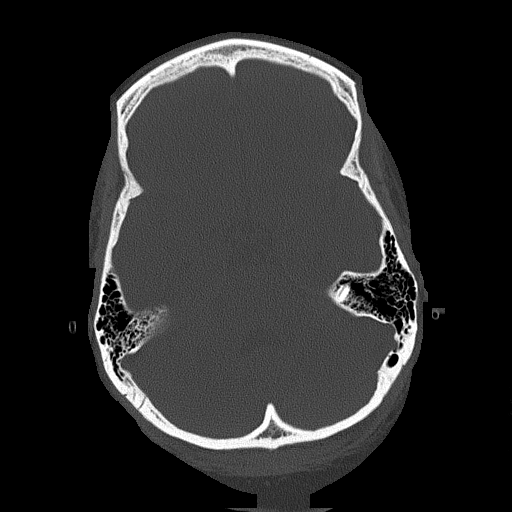

[Series 4: coronal soft tissue · coronal · 0.26mm/px · 3 of 60 slices shown]
[im 20/60  brain]
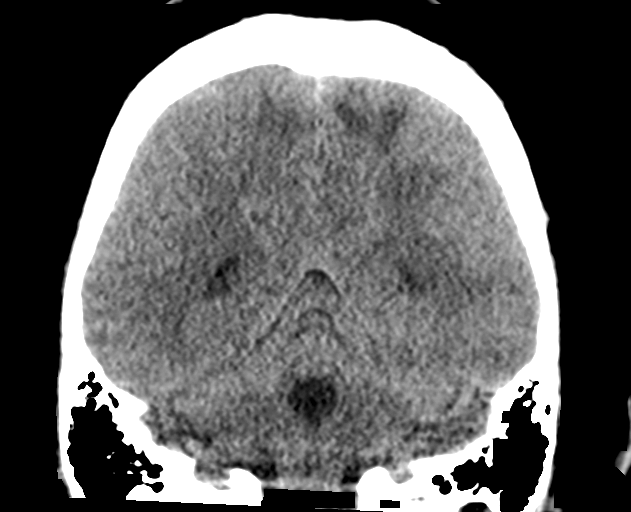
[im 27/60  brain]
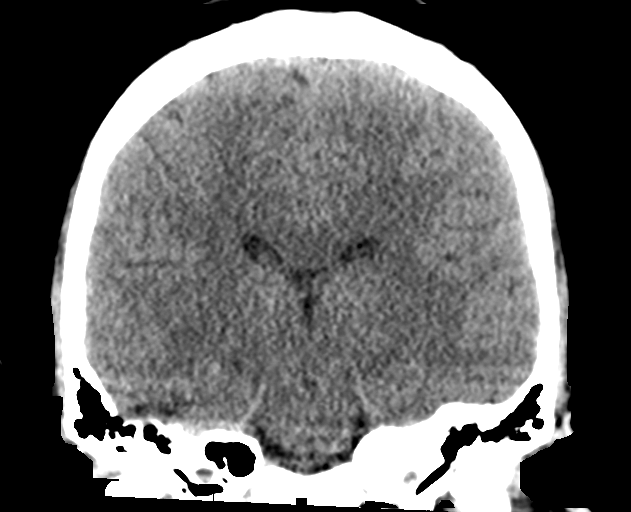
[im 33/60  brain]
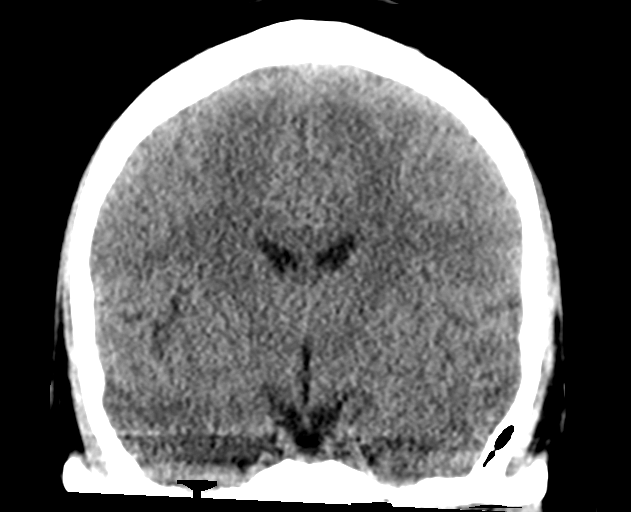

[Series 5: sagittal soft tissue · sagittal · 0.28mm/px · 3 of 49 slices shown]
[im 17/49  brain]
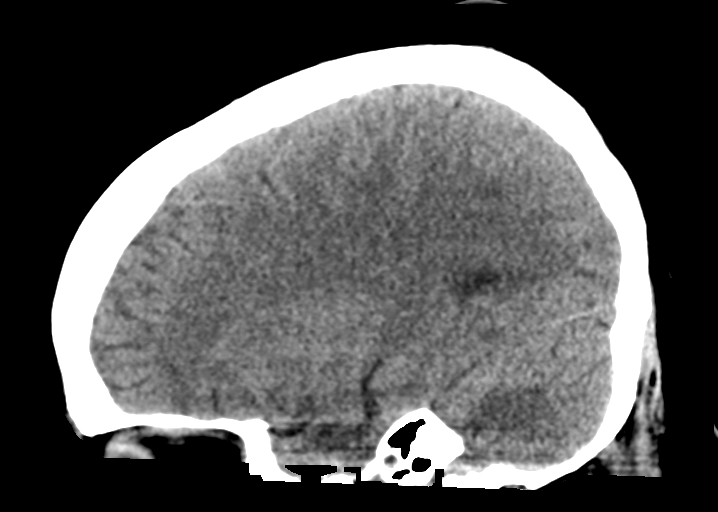
[im 25/49  brain]
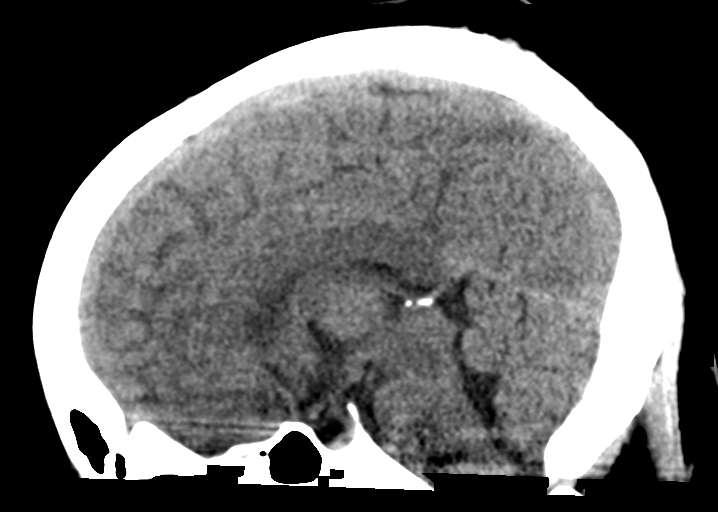
[im 33/49  brain]
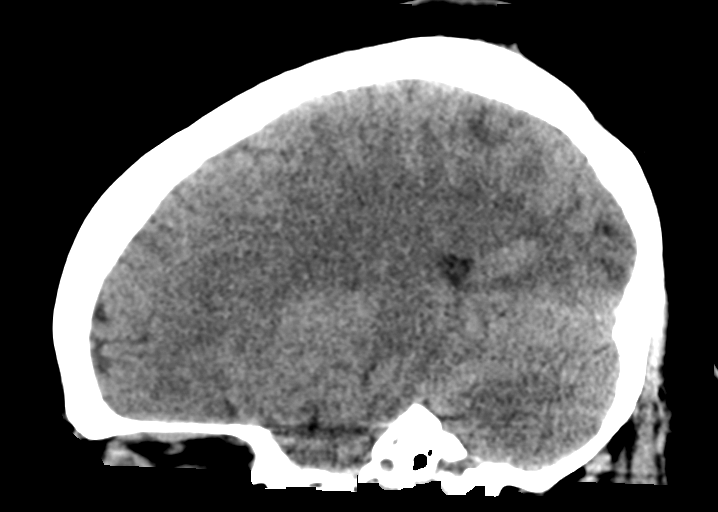

[15 of 47 positions shown; findings below may reference images not displayed]

FINDINGS: Brain: Accentuated hypodensity in the posterior vertex white matter
along with faint hypodensity posteriorly in the left
occipitoparietal region. Subtle focal hypodensity bilaterally along
the upper margin of the corpus callosum in the parietal lobe.

The brainstem, cerebellum, cerebral peduncles, thalami, basal
ganglia, and reticular system appear unremarkable.

No intracranial hemorrhage is observed.

Vascular: Unremarkable

Skull: Unremarkable

Sinuses/Orbits: Unremarkable

Other: No supplemental non-categorized findings.
IMPRESSION: 1. Hypodensities in the white matter near the parietal vertex and
potentially in the medial corona radiata just above the corpus
callosum, and in the left occipitoparietal region peripherally. The
findings are subtle but raise concern for the possibility of
posterior reversible encephalopathy. Correlate with blood pressure
measurements. MRI brain with and without contrast is recommended if
feasible for more specific characterization.

## 2019-06-17 ENCOUNTER — Encounter: Payer: Self-pay | Admitting: Obstetrics & Gynecology

## 2019-06-17 ENCOUNTER — Observation Stay
Admission: EM | Admit: 2019-06-17 | Discharge: 2019-06-18 | Disposition: A | Discharge status: Home / Self Care | Payer: Medicaid Other

## 2019-06-17 ENCOUNTER — Other Ambulatory Visit: Payer: Self-pay

## 2019-06-17 DIAGNOSIS — F17210 Nicotine dependence, cigarettes, uncomplicated: Secondary | ICD-10-CM | POA: Diagnosis not present

## 2019-06-17 DIAGNOSIS — Z7901 Long term (current) use of anticoagulants: Secondary | ICD-10-CM | POA: Insufficient documentation

## 2019-06-17 DIAGNOSIS — O2343 Unspecified infection of urinary tract in pregnancy, third trimester: Secondary | ICD-10-CM | POA: Diagnosis not present

## 2019-06-17 DIAGNOSIS — Z3A33 33 weeks gestation of pregnancy: Secondary | ICD-10-CM | POA: Insufficient documentation

## 2019-06-17 DIAGNOSIS — O99333 Smoking (tobacco) complicating pregnancy, third trimester: Secondary | ICD-10-CM | POA: Insufficient documentation

## 2019-06-17 DIAGNOSIS — O479 False labor, unspecified: Secondary | ICD-10-CM | POA: Diagnosis present

## 2019-06-17 DIAGNOSIS — O471 False labor at or after 37 completed weeks of gestation: Secondary | ICD-10-CM | POA: Diagnosis not present

## 2019-06-17 DIAGNOSIS — O4700 False labor before 37 completed weeks of gestation, unspecified trimester: Secondary | ICD-10-CM | POA: Diagnosis present

## 2019-06-17 HISTORY — DX: Guillain-Barre syndrome: G61.0

## 2019-06-17 HISTORY — DX: Critical illness polyneuropathy: G62.81

## 2019-06-17 LAB — URINALYSIS, COMPLETE (UACMP) WITH MICROSCOPIC
Bilirubin Urine: NEGATIVE
Glucose, UA: NEGATIVE mg/dL
Ketones, ur: NEGATIVE mg/dL
Nitrite: POSITIVE — AB
Protein, ur: 30 mg/dL — AB
Specific Gravity, Urine: 1.027 (ref 1.005–1.030)
WBC, UA: >50 WBC/hpf — ABNORMAL HIGH (ref 0–5)
pH: 6 (ref 5.0–8.0)

## 2019-06-17 MED ORDER — ACETAMINOPHEN 325 MG PO TABS
650.0000 mg | ORAL_TABLET | ORAL | Status: DC | PRN
Start: 2019-06-17 — End: 2019-06-18

## 2019-06-17 NOTE — OB Triage Note (Signed)
Pt is a G3P2 and states she has a due date of 08/03/19 for this current pregnancy. She is followed at [facility]. Pt presents to L&D with c/o ctx every 3 minutes apart since 1700 today. Pt rates ctx 8/10 on a 0-10 pain scale. Pt states she feels positive fetal movement and denies LOF. Pt states she has drank an adequate of fluid today and had intercourse last night on 06/16/19. Pt denies any needs at this time.

## 2019-06-18 ENCOUNTER — Encounter: Payer: Self-pay | Admitting: Obstetrics & Gynecology

## 2019-06-18 DIAGNOSIS — O471 False labor at or after 37 completed weeks of gestation: Secondary | ICD-10-CM | POA: Diagnosis not present

## 2019-06-18 MED ORDER — AMOXICILLIN-POT CLAVULANATE 875-125 MG PO TABS: 1.0000 | ORAL_TABLET | Freq: Two times a day (BID) | ORAL | 0 refills | Status: AC

## 2019-06-18 NOTE — Discharge Instructions (Signed)
Pregnancy and Urinary Tract Infection ° °A urinary tract infection (UTI) is an infection of any part of the urinary tract. This includes the kidneys, the tubes that connect your kidneys to your bladder (ureters), the bladder, and the tube that carries urine out of your body (urethra). These organs make, store, and get rid of urine in the body. Your health care provider may use other names to describe the infection. An upper UTI affects the ureters and kidneys (pyelonephritis). A lower UTI affects the bladder (cystitis) and urethra (urethritis). °Most urinary tract infections are caused by bacteria in your genital area, around the entrance to your urinary tract (urethra). These bacteria grow and cause irritation and inflammation of your urinary tract. You are more likely to develop a UTI during pregnancy because the physical and hormonal changes your body goes through can make it easier for bacteria to get into your urinary tract. Your growing baby also puts pressure on your bladder and can affect urine flow. It is important to recognize and treat UTIs in pregnancy because of the risk of serious complications for both you and your baby. °How does this affect me? °Symptoms of a UTI include: °· Needing to urinate right away (urgently). °· Frequent urination or passing small amounts of urine frequently. °· Pain or burning with urination. °· Blood in the urine. °· Urine that smells bad or unusual. °· Trouble urinating. °· Cloudy urine. °· Pain in the abdomen or lower back. °· Vaginal discharge. °You may also have: °· Vomiting or a decreased appetite. °· Confusion. °· Irritability or tiredness. °· A fever. °· Diarrhea. °How does this affect my baby? °An untreated UTI during pregnancy could lead to a kidney infection or a systemic infection, which can cause health problems that could affect your baby. Possible complications of an untreated UTI include: °· Giving birth to your baby before 37 weeks of pregnancy  (premature). °· Having a baby with a low birth weight. °· Developing high blood pressure during pregnancy (preeclampsia). °· Having a low hemoglobin level (anemia). °What can I do to lower my risk? °To prevent a UTI: °· Go to the bathroom as soon as you feel the need. Do not hold urine for long periods of time. °· Always wipe from front to back, especially after a bowel movement. Use each tissue one time when you wipe. °· Empty your bladder after sex. °· Keep your genital area dry. °· Drink 6-10 glasses of water each day. °· Do not douche or use deodorant sprays. °How is this treated? °Treatment for this condition may include: °· Antibiotic medicines that are safe to take during pregnancy. °· Other medicines to treat less common causes of UTI. °Follow these instructions at home: °· If you were prescribed an antibiotic medicine, take it as told by your health care provider. Do not stop using the antibiotic even if you start to feel better. °· Keep all follow-up visits as told by your health care provider. This is important. °Contact a health care provider if: °· Your symptoms do not improve or they get worse. °· You have abnormal vaginal discharge. °Get help right away if you: °· Have a fever. °· Have nausea and vomiting. °· Have back or side pain. °· Feel contractions in your uterus. °· Have lower belly pain. °· Have a gush of fluid from your vagina. °· Have blood in your urine. °Summary °· A urinary tract infection (UTI) is an infection of any part of the urinary tract, which includes the   kidneys, ureters, bladder, and urethra. °· Most urinary tract infections are caused by bacteria in your genital area, around the entrance to your urinary tract (urethra). °· You are more likely to develop a UTI during pregnancy. °· If you were prescribed an antibiotic medicine, take it as told by your health care provider. Do not stop using the antibiotic even if you start to feel better. °This information is not intended to  replace advice given to you by your health care provider. Make sure you discuss any questions you have with your health care provider. °Document Revised: 09/24/2018 Document Reviewed: 05/06/2018 °Elsevier Patient Education © 2020 [employer] Inc[employer] ° °

## 2019-06-18 NOTE — Discharge Summary (Signed)
Krista Perkins is a 24 y.o. female. She is at Unknown gestation. Patient's last menstrual period was 10/27/2018 (approximate). Estimated Date of Delivery: 08/05/19  Prenatal care site: [facility]   Chief complaint: Contractions  Reports uterine contractions that started around 5pm.  States that contractions have improved since arrival to L&D.  Reports intercourse last evening.  Denies LOF or vaginal bleeding.  Endorses good fetal movement.   S: Resting comfortably.   She reports:  -active fetal movement -no leakage of fluid -no vaginal bleeding -onset of contractions at 5pm but states they have improved since coming in  Maternal Medical History:   Past Medical History:  Diagnosis Date  . 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 Chest pain    a. 11/2017 CTA Chest: No PE. No coronary/aortic Ca2+.  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 Morbid obesity ([place])   . [facility] spotted fever   . Seizures ([place])     Past Surgical History:  Procedure Laterality Date  . DILATION AND CURETTAGE OF UTERUS  01/17/2018   Postpartum Hemorrhage   . IR EMBO ART  VEN HEMORR LYMPH EXTRAV  INC GUIDE ROADMAPPING  01/17/2018   [facility] - postpartum hemorrhage r/t vaginal hematoma    No Known Allergies  Prior to Admission medications   Medication Sig Start Date End Date Taking? Authorizing Provider  enoxaparin (LOVENOX) 40 MG/0.4ML injection Inject 40 mg into the skin daily.   Yes [provider]  Prenatal Vit-Fe Fumarate-FA (PRENATAL MULTIVITAMIN) TABS tablet Take 1 tablet by mouth daily at 12 noon.   Yes [provider]  amoxicillin-clavulanate (AUGMENTIN) 875-125 MG tablet Take 1 tablet by mouth 2 (two) times daily for 7 days. 06/18/19 06/25/19  Minda Meo, CNM  doxycycline (VIBRAMYCIN) 50 MG capsule Take 2 capsules (100 mg total) by mouth 2 (two) times daily. Patient not taking: Reported on 06/17/2019 01/04/18   Gae Dry, MD   omeprazole (PRILOSEC) 40 MG capsule Take 1 capsule (40 mg total) by mouth daily. Patient not taking: Reported on 01/04/2018 12/22/17   Malachy Mood, MD  ondansetron (ZOFRAN ODT) 4 MG disintegrating tablet Take 1 tablet (4 mg total) by mouth every 6 (six) hours as needed for nausea. Patient not taking: Reported on 01/04/2018 12/22/17   Malachy Mood, MD  oxyCODONE-acetaminophen (PERCOCET/ROXICET) 5-325 MG tablet Take 1 tablet by mouth every 4 (four) hours as needed for severe pain. Patient not taking: Reported on 06/17/2019 01/03/18   Paulette Blanch, MD  sucralfate (CARAFATE) 1 GM/10ML suspension Take 10 mLs (1 g total) by mouth 4 (four) times daily -  with meals and at bedtime. Patient not taking: Reported on 01/04/2018 12/22/17   Malachy Mood, MD     Social History: She  reports that she has been smoking cigarettes. She has been smoking about 0.25 packs per day. She has never used smokeless tobacco. She reports previous alcohol use. She reports that she does not use drugs.  Family History: family history includes Diabetes in her mother; Heart failure in her mother.  Review of Systems: A full review of systems was performed and negative except as noted in the HPI.    O:  BP 100/66 (BP Location: Left Arm)   Pulse 98   Temp 98.3 F (36.8 C) (Oral)   Resp 16   Ht 5' (1.524 m)   Wt 99.8 kg   LMP 10/27/2018 (Approximate)   BMI  42.97 kg/m  Results for orders placed or performed during the hospital encounter of 06/17/19 (from the past 48 hour(s))  Urinalysis, Complete w Microscopic   Collection Time: 06/17/19 11:34 PM  Result Value Ref Range   Color, Urine AMBER (A) YELLOW   APPearance CLOUDY (A) CLEAR   Specific Gravity, Urine 1.027 1.005 - 1.030   pH 6.0 5.0 - 8.0   Glucose, UA NEGATIVE NEGATIVE mg/dL   Hgb urine dipstick SMALL (A) NEGATIVE   Bilirubin Urine NEGATIVE NEGATIVE   Ketones, ur NEGATIVE NEGATIVE mg/dL   Protein, ur 30 (A) NEGATIVE mg/dL   Nitrite POSITIVE (A) NEGATIVE    Leukocytes,Ua LARGE (A) NEGATIVE   RBC / HPF 21-50 0 - 5 RBC/hpf   WBC, UA >50 (H) 0 - 5 WBC/hpf   Bacteria, UA MANY (A) NONE SEEN   Squamous Epithelial / LPF 11-20 0 - 5   Mucus PRESENT      Constitutional: NAD, AAOx3  HE/ENT: extraocular movements grossly intact, moist mucous membranes CV: RRR PULM: normal respiratory effort, CTABL     Abd: gravid, non-tender, non-distended, soft      Ext: Non-tender, Nonedmeatous   Psych: mood appropriate, speech normal Pelvic: deferred SVE: Cl/Th/High  NST:  Baseline: 135 Variability: moderate Accelerations: 15x15 present x >2 Decelerations: absent Toco: Occasional mild contraction  A/P: 24 y.o. Unknown here for antenatal surveillance during pregnancy.  Principle diagnosis: UTI in pregnancy   UTI in pregnancy  Hx of UTI in November. Treated with Macrobid.   Meds ordered this encounter  Medications  . acetaminophen (TYLENOL) tablet 650 mg  . amoxicillin-clavulanate (AUGMENTIN) 875-125 MG tablet    Sig: Take 1 tablet by mouth 2 (two) times daily for 7 days.    Dispense:  14 tablet    Refill:  0    Order Specific Question:   Supervising Provider    Answer:   Leola Brazil [id number]     Labor  Not present  Fetal Wellbeing  Reactive NST, reassuring for GA  D/c home stable, precautions reviewed, follow-up as scheduled.   Has scheduled appointment on Monday w/ [facility] M[facility]  Gustavo Lah 06/18/2019 1:26 AM  ----- Margaretmary Eddy, CNM Certified Nurse Midwife [facility] Department of OB/GYN [facility]

## 2019-07-02 ENCOUNTER — Encounter: Payer: Self-pay | Admitting: Obstetrics & Gynecology

## 2019-07-02 ENCOUNTER — Observation Stay
Admission: EM | Admit: 2019-07-02 | Discharge: 2019-07-02 | Disposition: A | Discharge status: Home / Self Care | Payer: Medicaid Other | Attending: Obstetrics and Gynecology | Admitting: Obstetrics and Gynecology

## 2019-07-02 DIAGNOSIS — O479 False labor, unspecified: Secondary | ICD-10-CM | POA: Diagnosis present

## 2019-07-02 DIAGNOSIS — O4703 False labor before 37 completed weeks of gestation, third trimester: Secondary | ICD-10-CM | POA: Diagnosis not present

## 2019-07-02 DIAGNOSIS — Z3A35 35 weeks gestation of pregnancy: Secondary | ICD-10-CM | POA: Diagnosis not present

## 2019-07-02 NOTE — Discharge Summary (Signed)
Patient ID: MARCHETA HORSEY MRN: [id number] DOB/AGE: Aug 04, 1995 24 y.o.  Admit date: 07/02/2019 Discharge date: 07/02/2019  Admission Diagnoses: Contractions  Discharge Diagnoses: Pregnancy at [redacted]w[redacted]d  Prenatal Procedures: none  Consults: none  Significant Diagnostic Studies: none  Treatments: none  Hospital Course:  This is a 24 y.o. [id number] with IUP at [redacted]w[redacted]d seen for labor contractions q15 min, noted to have a cervical exam of 0/50%/-3.  No leaking of fluid and no bleeding.  She was observed, fetal heart rate monitoring remained reassuring, and she had no signs/symptoms of labor.  She was deemed stable for discharge to home with outpatient follow up.    Discharge Physical Exam:  BP 106/65 (BP Location: Right Arm)   Pulse 80   Temp 98 F (36.7 C) (Oral)   Resp 18   Ht 5' (1.524 m)   LMP 10/27/2018 (Approximate)   SpO2 94%   BMI 42.97 kg/m   General: NAD CV: RRR Pulm: CTABL, nl effort ABD: s/nd/nt, gravid DVT Evaluation: LE non-ttp, no evidence of DVT on exam.  SVE: 0/50%/-3 FHT: FHR: 135 bpm, variability: moderate,  accelerations:  Present,  decelerations:  Absent TOCO: Quiet   Discharge Condition: Stable  Follow up:  Pt is already scheduled to follow up with [facility] for routine [facility] on 07/06/19 Discharge disposition: 01-Home or Self Care        Allergies as of 07/02/2019   No Known Allergies     Medication List    ASK your doctor about these medications   enoxaparin 40 MG/0.4ML injection Commonly known as: LOVENOX Inject 40 mg into the skin daily.   prenatal multivitamin Tabs tablet Take 1 tablet by mouth daily at 12 noon.        SignedHaroldine Laws, CNM 07/02/2019 3:46 PM

## 2019-07-02 NOTE — OB Triage Provider Note (Signed)
OB History & Physical   History of Present Illness:  Chief Complaint:   HPI:  NORMAN BIER is a 24 y.o. G54P2002 female at [redacted]w[redacted]d dated by a [redacted]w[redacted]d u/s with an EDD of 08/03/19.   Pt arrived via EMS with complaints of contractions that started around midnight, she has not timed them but thinks they may be about every 15 mins. Stating pain is 8 out of 10, pt is laying in bed in no visable acute distress.   Prenatal Care by [facility] MFM  She reports:  -active fetal movement -no leakage of fluid -no vaginal bleeding -onset of contractions 0030   Pregnancy Issues: Patient Active Problem List   Diagnosis Date Noted  . Preterm uterine contractions 06/17/2019  . RMSF [facility] spotted fever) 01/04/2018  . Pregnancy complicated by obesity, third trimester 01/04/2018  . Parvovirus B19 infection complicating pregnancy 01/04/2018  . Pyelonephritis 12/21/2017  . 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 Acute motor axonal neuropathy (HCC)   . Chest pain    a. 11/2017 CTA Chest: No PE. No coronary/aortic Ca2+.  Reyes Ivan syndrome (HCC)   . 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 Morbid obesity (HCC)   . [place] spotted fever   . Seizures (HCC)     Past Surgical History:  Procedure Laterality Date  . DILATION AND CURETTAGE OF UTERUS  01/17/2018   Postpartum Hemorrhage   . IR EMBO ART  VEN HEMORR LYMPH EXTRAV  INC GUIDE ROADMAPPING  01/17/2018   [facility] - postpartum hemorrhage r/t vaginal hematoma    No Known Allergies  Prior to Admission medications   Medication Sig Start Date End Date Taking? Authorizing Provider  enoxaparin (LOVENOX) 40 MG/0.4ML injection Inject 40 mg into the skin daily.   Yes [provider]  Prenatal Vit-Fe Fumarate-FA (PRENATAL MULTIVITAMIN) TABS tablet Take 1 tablet by mouth daily at 12 noon.    [provider]   Prenatal care site: [facility]  Social History: She  reports that she has been smoking cigarettes. She has been smoking about 0.25 packs per day. She has never used smokeless tobacco. She reports previous alcohol use. She reports that she does not use drugs.  Family History: family history includes Diabetes in her mother; Heart failure in her mother.   Review of Systems: A full review of systems was performed and negative except as noted in the HPI.    Physical Exam:  Vital Signs: BP 106/65 (BP Location: Right Arm)   Pulse 80   Temp 98 F (36.7 C) (Oral)   Resp 18   Ht 5' (1.524 m)   LMP 10/27/2018 (Approximate)   SpO2 94%   BMI 42.97 kg/m   General:   alert, cooperative and no distress  Skin:  normal  Neurologic:    Alert & oriented x 3  Lungs:   nl effort  Heart:   regular rate and rhythm  Abdomen:  soft, gravid  Pelvis:  Exam deferred.  FHT:  130 BPM  Presentations: cephalic  Cervix:    Dilation: Closed   Effacement: 50%   Station:  -3   Consistency: medium   Position: posterior  Extremities: : non-tender, symmetric    FHT: FHR: 135 bpm, variability: moderate,  accelerations:  Present,  decelerations:  Absent Category/reactivity:  Category I TOCO: none   Pertinent Results:  Prenatal Labs: Blood type/Rh O pos  Antibody screen Pos   Anit-K  Titers on 05/09/19 too weak  Rubella Immune  Varicella Immune  RPR NR  HBsAg Neg  HIV NR  GC Neg  Chlamydia Neg   Genetic screening   1 hour GTT 92  3 hour GTT   GBS   PCR 0.203 on 05/09/19  Drug screen Cannabinoid pos on 05/25/19     Assessment:  Merryn NYCOLE KAWAHARA is a 24 y.o. G72P2002 female at [redacted]w[redacted]d with high risk pregnancy here for contractions q 15 min.   Plan:   1. Fetal Well being  - Fetal Tracing: Reactive  2. Monitoring of Labor -  External toco in place and  quiet  3. D/c home - Precautions reviewed - Follow up with [facility] - next scheduled appt with [facility] MFM is 07/06/19  Linda Hedges, CNM 07/02/2019 7:24 AM

## 2019-07-02 NOTE — OB Triage Note (Addendum)
Pt arrived via EMS with complaints of contractions that started around midnight. Stating pain is 8 out of 10 with no leaking of fluid or vaginal bleeding. Patient is a poor historian, but states that she was seen and treated for a UTI at [facility] and is now on antibiotics. She could not recall the name of medication or start date.

## 2019-07-18 DIAGNOSIS — O365990 Maternal care for other known or suspected poor fetal growth, unspecified trimester, not applicable or unspecified: Secondary | ICD-10-CM | POA: Insufficient documentation

## 2021-04-08 DIAGNOSIS — F411 Generalized anxiety disorder: Secondary | ICD-10-CM | POA: Insufficient documentation

## 2021-04-08 DIAGNOSIS — R809 Proteinuria, unspecified: Secondary | ICD-10-CM | POA: Insufficient documentation

## 2022-11-03 DIAGNOSIS — Z124 Encounter for screening for malignant neoplasm of cervix: Secondary | ICD-10-CM | POA: Insufficient documentation

## 2023-04-01 DIAGNOSIS — K047 Periapical abscess without sinus: Secondary | ICD-10-CM | POA: Insufficient documentation

## 2023-04-17 ENCOUNTER — Ambulatory Visit (INDEPENDENT_AMBULATORY_CARE_PROVIDER_SITE_OTHER)
Admission: EM | Admit: 2023-04-17 | Discharge: 2023-04-17 | Disposition: A | Discharge status: Home / Self Care | Payer: Medicare Other | Source: Home / Self Care | Attending: Family Medicine | Admitting: Family Medicine

## 2023-04-17 ENCOUNTER — Encounter: Payer: Self-pay | Admitting: Emergency Medicine

## 2023-04-17 ENCOUNTER — Other Ambulatory Visit: Payer: Self-pay

## 2023-04-17 ENCOUNTER — Emergency Department
Admission: EM | Admit: 2023-04-17 | Discharge: 2023-04-18 | Disposition: A | Discharge status: Home / Self Care | Payer: Medicare Other | Attending: Emergency Medicine | Admitting: Emergency Medicine

## 2023-04-17 DIAGNOSIS — N3001 Acute cystitis with hematuria: Secondary | ICD-10-CM | POA: Insufficient documentation

## 2023-04-17 DIAGNOSIS — R1030 Lower abdominal pain, unspecified: Secondary | ICD-10-CM | POA: Insufficient documentation

## 2023-04-17 LAB — COMPREHENSIVE METABOLIC PANEL
ALT: 16 U/L (ref 0–44)
AST: 17 U/L (ref 15–41)
Albumin: 3.7 g/dL (ref 3.5–5.0)
Alkaline Phosphatase: 74 U/L (ref 38–126)
Anion gap: 9 (ref 5–15)
BUN: 9 mg/dL (ref 6–20)
CO2: 22 mmol/L (ref 22–32)
Calcium: 8.8 mg/dL — ABNORMAL LOW (ref 8.9–10.3)
Chloride: 105 mmol/L (ref 98–111)
Creatinine, Ser: 0.59 mg/dL (ref 0.44–1.00)
GFR, Estimated: >60 mL/min (ref >60)
Glucose, Bld: 92 mg/dL (ref 70–99)
Potassium: 3.5 mmol/L (ref 3.5–5.1)
Sodium: 136 mmol/L (ref 135–145)
Total Bilirubin: 0.7 mg/dL (ref 0.3–1.2)
Total Protein: 8.5 g/dL — ABNORMAL HIGH (ref 6.5–8.1)

## 2023-04-17 LAB — URINALYSIS, ROUTINE W REFLEX MICROSCOPIC
Bilirubin Urine: NEGATIVE
Glucose, UA: NEGATIVE mg/dL
Ketones, ur: NEGATIVE mg/dL
Nitrite: POSITIVE — AB
Protein, ur: 100 mg/dL — AB
RBC / HPF: >50 RBC/hpf (ref 0–5)
Specific Gravity, Urine: 1.013 (ref 1.005–1.030)
WBC, UA: >50 WBC/hpf (ref 0–5)
pH: 5 (ref 5.0–8.0)

## 2023-04-17 LAB — CBC
HCT: 36.9 % (ref 36.0–46.0)
Hemoglobin: 11.8 g/dL — ABNORMAL LOW (ref 12.0–15.0)
MCH: 28.8 pg (ref 26.0–34.0)
MCHC: 32 g/dL (ref 30.0–36.0)
MCV: 90 fL (ref 80.0–100.0)
Platelets: 284 10*3/uL (ref 150–400)
RBC: 4.1 MIL/uL (ref 3.87–5.11)
RDW: 14.5 % (ref 11.5–15.5)
WBC: 18.3 10*3/uL — ABNORMAL HIGH (ref 4.0–10.5)
nRBC: 0 % (ref 0.0–0.2)

## 2023-04-17 LAB — URINALYSIS, W/ REFLEX TO CULTURE (INFECTION SUSPECTED)
Bilirubin Urine: NEGATIVE
Glucose, UA: NEGATIVE mg/dL
Ketones, ur: NEGATIVE mg/dL
Nitrite: POSITIVE — AB
Protein, ur: >300 mg/dL — AB
Specific Gravity, Urine: 1.025 (ref 1.005–1.030)
WBC, UA: >50 WBC/hpf (ref 0–5)
pH: 6 (ref 5.0–8.0)

## 2023-04-17 LAB — POC URINE PREG, ED: Preg Test, Ur: NEGATIVE

## 2023-04-17 LAB — LIPASE, BLOOD: Lipase: 19 U/L (ref 11–51)

## 2023-04-17 MED ORDER — LIDOCAINE HCL (PF) 1 % IJ SOLN
1.0000 mL | Freq: Once | INTRAMUSCULAR | Status: AC
Start: 2023-04-17 — End: 2023-04-18
  Administered 2023-04-18: 2 mL
  Filled 2023-04-17: qty 5

## 2023-04-17 MED ORDER — NITROFURANTOIN MONOHYD MACRO 100 MG PO CAPS: 100.0000 mg | ORAL_CAPSULE | Freq: Two times a day (BID) | ORAL | 0 refills | Status: AC

## 2023-04-17 MED ORDER — CEFTRIAXONE SODIUM 1 G IJ SOLR
1.0000 g | Freq: Once | INTRAMUSCULAR | Status: AC
  Administered 2023-04-17: 1 g via INTRAMUSCULAR

## 2023-04-17 MED ORDER — CEFTRIAXONE SODIUM 1 G IJ SOLR
1.0000 g | Freq: Once | INTRAMUSCULAR | Status: AC
  Administered 2023-04-18: 1 g via INTRAMUSCULAR
  Filled 2023-04-17 (×2): qty 10

## 2023-04-17 NOTE — Discharge Instructions (Signed)
Your urine sample shows evidence of a urinary tract infection/UTI.  You are given an injection of antibiotics tonight as your pharmacy is closed.  Stop by your pharmacy tomorrow to pick up your antibiotics.  Take Motrin and/or Tylenol as needed for pain.  If you start having fevers, chills, worsening abdominal pain go to the emergency department as you may need IV antibiotics.   Go to ED for red flag symptoms, including; fevers you cannot reduce with Tylenol/Motrin, severe headaches, vision changes, numbness/weakness in part of the body, lethargy, confusion, intractable vomiting, severe dehydration, chest pain, breathing difficulty, severe persistent abdominal or pelvic pain, signs of severe infection (increased redness, swelling of an area), feeling faint or passing out, dizziness, etc. You should especially go to the ED for sudden acute worsening of condition if you do not elect to go at this time.

## 2023-04-17 NOTE — ED Provider Notes (Signed)
[facility]    CSN: [id number] Arrival date & time: 04/17/23  1831      History   Chief Complaint Chief Complaint  Patient presents with   Abdominal Pain    HPI Krista Perkins is a 27 y.o. female.   HPI  Krista Perkins presents for lower abdominal pain with urinary pressure and tinging with urination that started 3 days ago.  She thought it was related to her period.  Patient's last menstrual period was 04/10/2023. No vaginal discharge, hematuria, vaginal bleeding, nausea, vomiting, diarrhea, constipation and fever.  Has not been able to sleep due to the pain. No history of history of kidney stones.  Use Nexplanon for birth control.   Symptoms Nausea/Vomiting: {YES/NO/WILD CARDS:18581}  Diarrhea: {YES/NO/WILD CARDS:18581}  Constipation: {YES/NO/WILD CARDS:18581}  Melena/BRBPR: {YES/NO/WILD CARDS:18581}  Hematemesis: {YES/NO/WILD YNWGN:56213}  Anorexia: {YES/NO/WILD CARDS:18581}  Fever/Chills: {YES/NO/WILD CARDS:18581}  Dysuria: {YES/NO/WILD CARDS:18581}  Rash: {YES/NO/WILD CARDS:18581}  Wt loss: {YES/NO/WILD CARDS:18581}  EtOH use: {YES/NO/WILD CARDS:18581}  NSAIDs/ASA: {YES/NO/WILD CARDS:18581}  LMP: *** Vaginal bleeding: {YES/NO/WILD YQMVH:84696}  STD risk/hx: {YES/NO/WILD CARDS:18581}  Sore throat: no   Cough: no Nasal congestion : no  Sleep disturbance: no Back Pain: no Headache: no   Past Medical History:  Diagnosis Date   Acute motor axonal neuropathy (HCC)    Chest pain    a. 11/2017 CTA Chest: No PE. No coronary/aortic Ca2+.   Guillain Barr syndrome (HCC)    Mono exposure    Morbid obesity (HCC)    [place] spotted fever    Seizures [facility])     Patient Active Problem List   Diagnosis Date Noted   Uterine contractions during pregnancy 07/02/2019   Preterm uterine contractions 06/17/2019   RMSF [facility] spotted fever) 01/04/2018   Pregnancy complicated by obesity, third trimester 01/04/2018   Parvovirus B19 infection complicating  pregnancy 01/04/2018   Pyelonephritis 12/21/2017   High-risk pregnancy in third trimester 12/12/2017    Past Surgical History:  Procedure Laterality Date   DILATION AND CURETTAGE OF UTERUS  01/17/2018   Postpartum Hemorrhage    IR EMBO ART  VEN HEMORR LYMPH EXTRAV  INC GUIDE ROADMAPPING  01/17/2018   [facility] - postpartum hemorrhage r/t vaginal hematoma    OB History     Gravida  3   Para  2   Term  2   Preterm      AB      Living  2      SAB      IAB      Ectopic      Multiple      Live Births  2            Home Medications    Prior to Admission medications   Medication Sig Start Date End Date Taking? Authorizing Provider  enoxaparin (LOVENOX) 40 MG/0.4ML injection Inject 40 mg into the skin daily.    [provider]  Prenatal Vit-Fe Fumarate-FA (PRENATAL MULTIVITAMIN) TABS tablet Take 1 tablet by mouth daily at 12 noon.    [provider]    Family History Family History  Problem Relation Age of Onset   Diabetes Mother    Heart failure Mother     Social History Social History   Tobacco Use   Smoking status: Some Days    Current packs/day: 0.25    Types: Cigarettes   Smokeless tobacco: Never  Vaping Use   Vaping status: Never Used  Substance Use Topics   Alcohol  use: Not Currently   Drug use: Never     Allergies   Patient has no known allergies.   Review of Systems Review of Systems :negative unless otherwise stated in HPI.      Physical Exam Triage Vital Signs ED Triage Vitals  Encounter Vitals Group     BP 04/17/23 1924 (!) 150/92     Systolic BP Percentile --      Diastolic BP Percentile --      Pulse Rate 04/17/23 1924 90     Resp --      Temp 04/17/23 1924 98.5 F (36.9 C)     Temp Source 04/17/23 1924 Oral     SpO2 04/17/23 1924 99 %     Weight 04/17/23 1923 246 lb (111.6 kg)     Height 04/17/23 1923 5' (1.524 m)     Head Circumference --      Peak Flow --      Pain Score 04/17/23  1920 10     Pain Loc --      Pain Education --      Exclude from Growth Chart --    No data found.  Updated Vital Signs BP (!) 150/92 (BP Location: Left Arm)   Pulse 90   Temp 98.5 F (36.9 C) (Oral)   Ht 5' (1.524 m)   Wt 111.6 kg   LMP 04/10/2023   SpO2 99%   BMI 48.04 kg/m   Visual Acuity Right Eye Distance:   Left Eye Distance:   Bilateral Distance:    Right Eye Near:   Left Eye Near:    Bilateral Near:     Physical Exam  GEN: pleasant well appearing female, in no acute distress *** CV: regular rate and rhythm, no murmurs appreciated *** RESP: no increased work of breathing, clear to ascultation bilaterally ABD: Bowel sounds present. Soft,***non-tender,***non-distended. ***No guarding,***no rebound,***no appreciable hepatosplenomegaly,***no CVA tenderness,***negative McBurney's,***negative Murphy MSK: no extremity edema SKIN: warm, dry, no rash on visible skin NEURO: alert, moves all extremities appropriately PSYCH: Normal affect, appropriate speech and behavior   UC Treatments / Results  Labs (all labs ordered are listed, but only abnormal results are displayed) Labs Reviewed  URINALYSIS, W/ REFLEX TO CULTURE (INFECTION SUSPECTED) - Abnormal; Notable for the following components:      Result Value   APPearance CLOUDY (*)    Hgb urine dipstick LARGE (*)    Protein, ur >300 (*)    Nitrite POSITIVE (*)    Leukocytes,Ua MODERATE (*)    Bacteria, UA MANY (*)    All other components within normal limits  URINE CULTURE    EKG  If EKG performed, see my interpretation and MDM section  Radiology No results found.   Procedures Procedures (including critical care time)  Medications Ordered in UC Medications - No data to display  Initial Impression / Assessment and Plan / UC Course  I have reviewed the triage vital signs and the nursing notes.  Pertinent labs & imaging results that were available during my care of the patient were reviewed by me and  considered in my medical decision making (see chart for details).     ***  Patient is a  27 y.o. femalewith history *** who presents after having insidious severe abdominal pain about *** ago.  Overall, patient is well-appearing, well-hydrated, and in no acute distress.  Vital signs stable.  [name] afebrile.  Exam is ***not concerning for an acute abdomen.  Obtained UA, urine pregnancy***, CBC, CMP,  and lipase.  No personal history of kidney stones.      DDX:  -UTI: Urine unremarkable -STI: Not sexually active -Ovarian torsion versus cyst: Pain is epigastric less likely -Biliary colic/gallstone: Labs today, consider right upper quadrant ultrasound -Pancreatitis: Lipase today -Gastroenteritis: Not likely given no vomiting or diarrhea -Kidney stone: No hematuria on UA, no CVA tenderness -Constipation: None reported -Appendicitis: status post recent appendectomy   Constipation: ***Provided and reviewed constipation clean out and maintenance plan with patient and ***. Discussed eating small meals frequently and increased fluid intake, as well as gradual increase in fiber intake with dried fruits, vegetables with skins, beans, whole grains and cereal. Goal ***25-35g of fiber per day. Encouraged drinking hot beverages and prune juice; add probiotic-containing foods like pasteurized yogurt and kefir; encourage increased physical activity.     Follow-up, return and ED precautions given.  Discussed MDM, treatment plan and plan for follow-up with patient/parent who agrees with plan.    Final Clinical Impressions(s) / UC Diagnoses   Final diagnoses:  None   Discharge Instructions   None    ED Prescriptions   None    PDMP not reviewed this encounter.

## 2023-04-17 NOTE — ED Triage Notes (Signed)
Pt presents after being seen at [facility] today and told to come in to the ED if her pain got worse.  Lower abdominal cramping. No n/v/d or shob. No CP.  Does have some "tingling" with urination. No fever or chills.

## 2023-04-17 NOTE — ED Triage Notes (Signed)
Pt c/o lower abdominal pain x3-4days  Pt states that it feels like cramping across the stomach.   Pt states that she is having urinary odor.   Pt has "tingling" when she urinates.

## 2023-04-17 NOTE — ED Notes (Signed)
Patient to ED waiting room via wheelchair by EMS for lower abd pain.  EMS reports [facility] and dx'd with possible UTI or kidney stone received pain shot there and antibiotic prescription (unable to fill due to time).  EMS intervention  BP 120/68, pulse 95, pulse oxi 95% room air.

## 2023-04-18 DIAGNOSIS — N3001 Acute cystitis with hematuria: Secondary | ICD-10-CM | POA: Diagnosis not present

## 2023-04-18 MED ORDER — CEPHALEXIN 500 MG PO CAPS: 500.0000 mg | ORAL_CAPSULE | Freq: Four times a day (QID) | ORAL | 0 refills | Status: AC

## 2023-04-18 NOTE — Discharge Instructions (Signed)
Your workup today suggests that you have a urinary tract infection (UTI).  Please take your antibiotic as prescribed and over-the-counter pain medication (Tylenol or Motrin) as needed, but no more than recommended on the label instructions.  Drink PLENTY of fluids.  Call your regular doctor to schedule the next available appointment to follow up on todays ED visit, or return immediately to the ED if your pain worsens, you have decreased urine production, develop fever, persistent vomiting, or other symptoms that concern you.

## 2023-04-18 NOTE — ED Provider Notes (Signed)
[facility] Provider Note    Event Date/Time   First MD Initiated Contact with Krista Perkins 04/17/23 2256     (approximate)   History   Abdominal Pain   HPI Krista Perkins is a 27 y.o. female who presents for evaluation of lower abdominal pain that is gradually been getting worse over the last 3 to 4 days.  Krista Perkins initially thought it was related to her menstrual cycle which was several days before that, but it has persisted and gradually gotten worse.  Krista Perkins has had increased urinary frequency and some "tingling" and discomfort when Krista Perkins urinates.  Krista Perkins denies fever, nausea, vomiting, diarrhea, chest pain, shortness of breath.     Physical Exam   Triage Vital Signs: ED Triage Vitals  Encounter Vitals Group     BP 04/17/23 2140 124/81     Systolic BP Percentile --      Diastolic BP Percentile --      Pulse Rate 04/17/23 2140 97     Resp 04/17/23 2140 18     Temp 04/17/23 2140 99.1 F (37.3 C)     Temp Source 04/17/23 2140 Oral     SpO2 04/17/23 2140 97 %     Weight --      Height --      Head Circumference --      Peak Flow --      Pain Score 04/17/23 2139 10     Pain Loc --      Pain Education --      Exclude from Growth Chart --     Most recent vital signs: Vitals:   04/18/23 0230 04/18/23 0245  BP:    Pulse: 95 98  Resp: 14   Temp:    SpO2: 96% 98%    General: Awake, no obvious distress. CV:  Good peripheral perfusion.  Regular rate and rhythm. Resp:  Normal effort. Speaking easily and comfortably, no accessory muscle usage nor intercostal retractions.   Abd:  No distention.  Morbid obesity.  No tenderness to palpation throughout the abdomen; Krista Perkins does not react to palpation, but indicates the suprapubic region is where it hurts.   ED Results / Procedures / Treatments   Labs (all labs ordered are listed, but only abnormal results are displayed) Labs Reviewed  COMPREHENSIVE METABOLIC PANEL - Abnormal; Notable for the following components:       Result Value   Calcium 8.8 (*)    Total Protein 8.5 (*)    All other components within normal limits  CBC - Abnormal; Notable for the following components:   WBC 18.3 (*)    Hemoglobin 11.8 (*)    All other components within normal limits  URINALYSIS, ROUTINE W REFLEX MICROSCOPIC - Abnormal; Notable for the following components:   Color, Urine YELLOW (*)    APPearance CLOUDY (*)    Hgb urine dipstick MODERATE (*)    Protein, ur 100 (*)    Nitrite POSITIVE (*)    Leukocytes,Ua LARGE (*)    Bacteria, UA MANY (*)    All other components within normal limits  URINE CULTURE  LIPASE, BLOOD  POC URINE PREG, ED     PROCEDURES:  Critical Care performed: No  Procedures    IMPRESSION / MDM / ASSESSMENT AND PLAN / ED COURSE  I reviewed the triage vital signs and the nursing notes.  Differential diagnosis includes, but is not limited to, UTI/plantar fasciitis, STD/PID/TOA, diverticulitis, appendicitis.  Krista Perkins's presentation is most consistent with acute presentation with potential threat to life or bodily function.  Labs/studies ordered: Urinalysis, urine culture, urine pregnancy test, lipase, CMP, CBC  Interventions/Medications given:  Medications  cefTRIAXone (ROCEPHIN) injection 1 g (1 g Intramuscular Given 04/18/23 0008)  lidocaine (PF) (XYLOCAINE) 1 % injection 1-2.1 mL (2 mLs Other Given 04/18/23 0008)    (Note:  hospital course my include additional interventions and/or labs/studies not listed above.)   Vital signs are stable and within normal limits and the Krista Perkins does not meet sepsis criteria despite a white blood cell count of 18.  Krista Perkins has a grossly positive urinalysis but fortunately minimal if any tenderness to palpation and no flank pain.  No suggestion of pyelonephritis at this time.  No indication for imaging.  I ordered ceftriaxone 1 g IM to begin treatment and will treat empirically for UTI.  I went over all of this with the  Krista Perkins and Krista Perkins understands and agrees with the plan for outpatient management.  I gave strict return precautions should Krista Perkins develop new or worsening symptoms.  Of note, I asked her if Krista Perkins had any concerns about possible STDs and Krista Perkins said no so we did not proceed with any additional evaluation at this time.       FINAL CLINICAL IMPRESSION(S) / ED DIAGNOSES   Final diagnoses:  Acute cystitis with hematuria     Rx / DC Orders   ED Discharge Orders          Ordered    cephALEXin (KEFLEX) 500 MG capsule  4 times daily        04/18/23 0241             Note:  This document was prepared using Dragon voice recognition software and may include unintentional dictation errors.   Loleta Rose, MD 04/18/23 [phone]

## 2023-04-19 LAB — URINE CULTURE: Culture: <10000 — AB

## 2023-04-20 LAB — URINE CULTURE: Culture: >=100000 — AB

## 2023-05-18 ENCOUNTER — Ambulatory Visit
Admission: EM | Admit: 2023-05-18 | Discharge: 2023-05-18 | Disposition: A | Discharge status: Home / Self Care | Payer: Medicare Other | Attending: Internal Medicine | Admitting: Internal Medicine

## 2023-05-18 DIAGNOSIS — A084 Viral intestinal infection, unspecified: Secondary | ICD-10-CM | POA: Diagnosis not present

## 2023-05-18 DIAGNOSIS — R112 Nausea with vomiting, unspecified: Secondary | ICD-10-CM | POA: Diagnosis not present

## 2023-05-18 MED ORDER — ONDANSETRON 4 MG PO TBDP: 4.0000 mg | ORAL_TABLET | Freq: Three times a day (TID) | ORAL | 0 refills | Status: AC | PRN

## 2023-05-18 MED ORDER — ONDANSETRON 4 MG PO TBDP
4.0000 mg | ORAL_TABLET | Freq: Once | ORAL | Status: AC
Start: 2023-05-18 — End: 2023-05-18
  Administered 2023-05-18: 4 mg via ORAL

## 2023-05-18 NOTE — ED Triage Notes (Signed)
Sx started Friday. Abdominal pain-diarrhea-vomiting. Cramping pain.

## 2023-05-18 NOTE — Discharge Instructions (Addendum)
He may take Zofran every 8 hours as needed for nausea and vomiting.  You were given a dose while in the clinic.  Please focus on hydration/electrolyte replacement with Gatorade, Powerade, Pedialyte.  You may do a brat diet (bananas, rice, applesauce, toast) and advance as you tolerate.  Lots of rest.  Please follow-up with your PCP in 2 to 3 days for recheck.  Please go to the ER if you develop any worsening symptoms such as fever, blood in the vomit or stool, inability to stay hydrated, or any new concerns that arise.  I hope you feel better soon!

## 2023-05-18 NOTE — ED Provider Notes (Addendum)
[facility]    CSN: [id number] Arrival date & time: 05/18/23  0102      History   Chief Complaint Chief Complaint  Patient presents with   Diarrhea   Emesis   Abdominal Pain    HPI Krista Perkins is a 27 y.o. female presents for nausea and vomiting.  Patient reports 4 days of nonbilious nonbloody nausea and vomiting with diarrhea.  Endorses abdominal cramping/generalized pain as well.  Reports 2-3 episodes of vomiting throughout the day and about the same for diarrhea.  Reports diarrhea is all liquid with no formed stool.  No fevers, chills, URI symptoms.  No recent travel.  No sick contacts.  Denies ingestion of contaminated or undercooked foods.  No history of GI diagnoses such as Crohn's, IBS, colitis.  Reports is able to keep some fluids down and is peeing normally.  Has not been able to eat much.  Denies any abdominal surgeries.  Has not taken any OTC medication for symptoms.  Denies breast-feeding or pregnancy risk/concern.  States last menstrual period was November 20.  no other concerns at this time.   Diarrhea Associated symptoms: abdominal pain and vomiting   Emesis Associated symptoms: abdominal pain and diarrhea   Abdominal Pain Associated symptoms: diarrhea and vomiting     Past Medical History:  Diagnosis Date   Acute motor axonal neuropathy (HCC)    Chest pain    a. 11/2017 CTA Chest: No PE. No coronary/aortic Ca2+.   Guillain Barr syndrome (HCC)    Mono exposure    Morbid obesity (HCC)    [place] spotted fever    Seizures [facility])     Patient Active Problem List   Diagnosis Date Noted   Uterine contractions during pregnancy 07/02/2019   Preterm uterine contractions 06/17/2019   [facility] spotted fever) 01/04/2018   Pregnancy complicated by obesity, third trimester 01/04/2018   Parvovirus B19 infection complicating pregnancy 01/04/2018   Pyelonephritis 12/21/2017   High-risk pregnancy in third trimester 12/12/2017    Past  Surgical History:  Procedure Laterality Date   DILATION AND CURETTAGE OF UTERUS  01/17/2018   Postpartum Hemorrhage    IR EMBO ART  VEN HEMORR LYMPH EXTRAV  INC GUIDE ROADMAPPING  01/17/2018   [facility] [place] - postpartum hemorrhage r/t vaginal hematoma    OB History     Gravida  3   Para  2   Term  2   Preterm      AB      Living  2      SAB      IAB      Ectopic      Multiple      Live Births  2            Home Medications    Prior to Admission medications   Medication Sig Start Date End Date Taking? Authorizing Provider  ondansetron (ZOFRAN-ODT) 4 MG disintegrating tablet Take 1 tablet (4 mg total) by mouth every 8 (eight) hours as needed for nausea or vomiting. 05/18/23  Yes Radford Pax, NP  cephALEXin (KEFLEX) 500 MG capsule Take 1 capsule (500 mg total) by mouth 4 (four) times daily. 04/18/23   Loleta Rose, MD  enoxaparin (LOVENOX) 40 MG/0.4ML injection Inject 40 mg into the skin daily.    [provider]  nitrofurantoin, macrocrystal-monohydrate, (MACROBID) 100 MG capsule Take 1 capsule (100 mg total) by mouth 2 (two) times daily. 04/17/23   Katha Cabal, DO  Prenatal Vit-Fe Fumarate-FA (PRENATAL MULTIVITAMIN) TABS tablet Take 1 tablet by mouth daily at 12 noon.    [provider]    Family History Family History  Problem Relation Age of Onset   Diabetes Mother    Heart failure Mother     Social History Social History   Tobacco Use   Smoking status: Some Days    Current packs/day: 0.25    Types: Cigarettes   Smokeless tobacco: Never  Vaping Use   Vaping status: Never Used  Substance Use Topics   Alcohol use: Not Currently   Drug use: Yes    Types: Marijuana    Comment: last time smoked 11/27     Allergies   Patient has no known allergies.   Review of Systems Review of Systems  Gastrointestinal:  Positive for abdominal pain, diarrhea and vomiting.     Physical Exam Triage Vital Signs ED Triage Vitals   Encounter Vitals Group     BP 05/18/23 0952 129/81     Systolic BP Percentile --      Diastolic BP Percentile --      Pulse Rate 05/18/23 0952 82     Resp 05/18/23 0952 18     Temp 05/18/23 0952 98.9 F (37.2 C)     Temp src --      SpO2 05/18/23 0952 99 %     Weight --      Height --      Head Circumference --      Peak Flow --      Pain Score 05/18/23 0950 10     Pain Loc --      Pain Education --      Exclude from Growth Chart --    No data found.  Updated Vital Signs BP 129/81 (BP Location: Right Wrist)   Pulse 82   Temp 98.9 F (37.2 C)   Resp 18   LMP 05/06/2023 (Approximate)   SpO2 99%   Visual Acuity Right Eye Distance:   Left Eye Distance:   Bilateral Distance:    Right Eye Near:   Left Eye Near:    Bilateral Near:     Physical Exam Vitals and nursing note reviewed.  Constitutional:      General: She is not in acute distress.    Appearance: Normal appearance. She is obese. She is not ill-appearing, toxic-appearing or diaphoretic.  HENT:     Head: Normocephalic and atraumatic.  Eyes:     Pupils: Pupils are equal, round, and reactive to light.  Cardiovascular:     Rate and Rhythm: Normal rate.  Pulmonary:     Effort: Pulmonary effort is normal.  Abdominal:     General: Bowel sounds are increased.     Palpations: Abdomen is soft. There is no hepatomegaly or splenomegaly.     Tenderness: There is generalized abdominal tenderness. There is no right CVA tenderness or left CVA tenderness. Negative signs include Rovsing's sign and McBurney's sign.     Comments: Body habitus somewhat limits exam.  Skin:    General: Skin is warm and dry.  Neurological:     General: No focal deficit present.     Mental Status: She is alert and oriented to person, place, and time.  Psychiatric:        Mood and Affect: Mood normal.        Behavior: Behavior normal.      UC Treatments / Results  Labs (all labs ordered are listed, but  only abnormal results are  displayed) Labs Reviewed - No data to display  EKG   Radiology No results found.  Procedures Procedures (including critical care time)  Medications Ordered in UC Medications  ondansetron (ZOFRAN-ODT) disintegrating tablet 4 mg (4 mg Oral Given 05/18/23 1019)    Initial Impression / Assessment and Plan / UC Course  I have reviewed the triage vital signs and the nursing notes.  Pertinent labs & imaging results that were available during my care of the patient were reviewed by me and considered in my medical decision making (see chart for details).     Reviewed exam and symptoms with patient.  She is well-appearing and in no acute distress with stable vital signs.  No red flags.  Discussed symptoms and exam consistent with viral gastroenteritis.  She was given Zofran in clinic and Rx sent to pharmacy.  Reinforced hydration/electrolyte replacement as well as a brat diet.  PCP follow-up 2 to 3 days for recheck.  Strict ER precautions reviewed and patient verbalized understanding. Final Clinical Impressions(s) / UC Diagnoses   Final diagnoses:  Nausea and vomiting, unspecified vomiting type  Viral gastroenteritis     Discharge Instructions      He may take Zofran every 8 hours as needed for nausea and vomiting.  You were given a dose while in the clinic.  Please focus on hydration/electrolyte replacement with Gatorade, Powerade, Pedialyte.  You may do a brat diet (bananas, rice, applesauce, toast) and advance as you tolerate.  Lots of rest.  Please follow-up with your PCP in 2 to 3 days for recheck.  Please go to the ER if you develop any worsening symptoms such as fever, blood in the vomit or stool, inability to stay hydrated, or any new concerns that arise.  I hope you feel better soon!    ED Prescriptions     Medication Sig Dispense Auth. Provider   ondansetron (ZOFRAN-ODT) 4 MG disintegrating tablet Take 1 tablet (4 mg total) by mouth every 8 (eight) hours as needed for nausea  or vomiting. 10 tablet Radford Pax, NP      PDMP not reviewed this encounter.   Radford Pax, NP 05/18/23 1022    Radford Pax, NP 05/18/23 1024

## 2023-10-23 ENCOUNTER — Ambulatory Visit: Admission: EM | Admit: 2023-10-23

## 2024-06-29 DIAGNOSIS — R0683 Snoring: Secondary | ICD-10-CM | POA: Insufficient documentation

## 2024-06-29 DIAGNOSIS — Z720 Tobacco use: Secondary | ICD-10-CM | POA: Insufficient documentation

## 2024-07-03 DIAGNOSIS — E785 Hyperlipidemia, unspecified: Secondary | ICD-10-CM | POA: Insufficient documentation

## 2024-07-07 ENCOUNTER — Encounter: Payer: Self-pay | Admitting: Sleep Medicine

## 2024-07-08 ENCOUNTER — Ambulatory Visit: Admitting: Sleep Medicine

## 2024-07-08 DIAGNOSIS — R0683 Snoring: Secondary | ICD-10-CM
